# Patient Record
Sex: Female | Born: 2000 | Race: White | Hispanic: No | Marital: Married | Smoking: Former smoker
Health system: Southern US, Community
[De-identification: ages and names within clinical notes are randomized; demographics above are authoritative.]

## PROBLEM LIST (undated history)

## (undated) ENCOUNTER — Emergency Department (HOSPITAL_COMMUNITY): Admission: EM | Payer: BC Managed Care – PPO

## (undated) DIAGNOSIS — J302 Other seasonal allergic rhinitis: Secondary | ICD-10-CM

## (undated) DIAGNOSIS — M419 Scoliosis, unspecified: Secondary | ICD-10-CM

## (undated) DIAGNOSIS — O039 Complete or unspecified spontaneous abortion without complication: Secondary | ICD-10-CM

## (undated) DIAGNOSIS — F329 Major depressive disorder, single episode, unspecified: Secondary | ICD-10-CM

## (undated) DIAGNOSIS — N390 Urinary tract infection, site not specified: Secondary | ICD-10-CM

## (undated) DIAGNOSIS — F32A Depression, unspecified: Secondary | ICD-10-CM

## (undated) HISTORY — DX: Scoliosis, unspecified: M41.9

## (undated) HISTORY — DX: Other seasonal allergic rhinitis: J30.2

## (undated) HISTORY — DX: Depression, unspecified: F32.A

## (undated) HISTORY — DX: Major depressive disorder, single episode, unspecified: F32.9

## (undated) HISTORY — DX: Urinary tract infection, site not specified: N39.0

## (undated) HISTORY — PX: TONSILLECTOMY AND ADENOIDECTOMY: SUR1326

## (undated) HISTORY — DX: Complete or unspecified spontaneous abortion without complication: O03.9

---
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
## 2018-01-16 DIAGNOSIS — F330 Major depressive disorder, recurrent, mild: Secondary | ICD-10-CM | POA: Diagnosis not present

## 2018-01-30 DIAGNOSIS — F330 Major depressive disorder, recurrent, mild: Secondary | ICD-10-CM | POA: Diagnosis not present

## 2018-05-15 ENCOUNTER — Ambulatory Visit: Payer: BLUE CROSS/BLUE SHIELD | Admitting: Internal Medicine

## 2018-05-15 ENCOUNTER — Encounter: Payer: Self-pay | Admitting: Medical

## 2018-05-15 ENCOUNTER — Encounter: Payer: Self-pay | Admitting: Internal Medicine

## 2018-05-15 ENCOUNTER — Other Ambulatory Visit (HOSPITAL_COMMUNITY)
Admission: RE | Admit: 2018-05-15 | Discharge: 2018-05-15 | Disposition: A | Discharge status: Home / Self Care | Payer: BLUE CROSS/BLUE SHIELD | Source: Ambulatory Visit | Attending: Internal Medicine | Admitting: Internal Medicine

## 2018-05-15 VITALS — BP 104/70 | HR 85 | Temp 98.6°F | Resp 16 | Ht 63.0 in | Wt 126.0 lb

## 2018-05-15 DIAGNOSIS — N761 Subacute and chronic vaginitis: Secondary | ICD-10-CM | POA: Diagnosis not present

## 2018-05-15 DIAGNOSIS — F329 Major depressive disorder, single episode, unspecified: Secondary | ICD-10-CM | POA: Diagnosis not present

## 2018-05-15 DIAGNOSIS — N898 Other specified noninflammatory disorders of vagina: Secondary | ICD-10-CM

## 2018-05-15 DIAGNOSIS — R3915 Urgency of urination: Secondary | ICD-10-CM | POA: Diagnosis not present

## 2018-05-15 DIAGNOSIS — N926 Irregular menstruation, unspecified: Secondary | ICD-10-CM | POA: Diagnosis not present

## 2018-05-15 DIAGNOSIS — Z7722 Contact with and (suspected) exposure to environmental tobacco smoke (acute) (chronic): Secondary | ICD-10-CM | POA: Insufficient documentation

## 2018-05-15 LAB — POC URINALSYSI DIPSTICK (AUTOMATED)
Bilirubin, UA: NEGATIVE
Blood, UA: NEGATIVE
Glucose, UA: NEGATIVE
Ketones, UA: NEGATIVE
Leukocytes, UA: NEGATIVE
Nitrite, UA: NEGATIVE
PROTEIN UA: NEGATIVE
SPEC GRAV UA: 1.02 (ref 1.010–1.025)
UROBILINOGEN UA: 0.2 U/dL
pH, UA: 6.5 (ref 5.0–8.0)

## 2018-05-15 LAB — POCT URINE PREGNANCY: Preg Test, Ur: NEGATIVE

## 2018-05-15 NOTE — Progress Notes (Signed)
Subjective:    Patient ID: April Murray, female    DOB: 2001-02-20, 17 y.o.   MRN: [id number]  DOS:  05/15/2018 Type of visit - description : Acute visit Interval history: Patient reports some "odor" from her private area for the last 2 months. She takes birth control pills regularly,  LMP was in  July, did not have one in August. She is sexually active, has one partner, does not use condoms regularly.   Review of Systems Denies fever chills No nausea, vomiting, diarrhea Mild, occasional lower abdominal discomfort.  No dyspareunia. No vaginal discharge or rash No dysuria or gross hematuria.  Occasionally has urinary urgency.   Past Medical History:  Diagnosis Date  . Depression   . Scoliosis   . Seasonal allergies   . UTI (lower urinary tract infection)     Past Surgical History:  Procedure Laterality Date  . TONSILLECTOMY AND ADENOIDECTOMY      Social History   Socioeconomic History  . Marital status: Single    Spouse name: Not on file  . Number of children: Not on file  . Years of education: Not on file  . Highest education level: Not on file  Occupational History  . Not on file  Social Needs  . Financial resource strain: Not on file  . Food insecurity:    Worry: Not on file    Inability: Not on file  . Transportation needs:    Medical: Not on file    Non-medical: Not on file  Tobacco Use  . Smoking status: Passive Smoke Exposure - Never Smoker  . Smokeless tobacco: Never Used  . Tobacco comment: Mother smokes in her room  Substance and Sexual Activity  . Alcohol use: Not on file  . Drug use: Not on file  . Sexual activity: Not on file  Lifestyle  . Physical activity:    Days per week: Not on file    Minutes per session: Not on file  . Stress: Not on file  Relationships  . Social connections:    Talks on phone: Not on file    Gets together: Not on file    Attends religious service: Not on file    Active member of club or organization: Not on  file    Attends meetings of clubs or organizations: Not on file    Relationship status: Not on file  . Intimate partner violence:    Fear of current or ex partner: Not on file    Emotionally abused: Not on file    Physically abused: Not on file    Forced sexual activity: Not on file  Other Topics Concern  . Not on file  Social History Narrative   April Murray is an 11th grade student at [employer]; she does well in school but has bad days. She lives with her parents and brother.       Sees Terri Bauert, LCSW at [employer]      Allergies as of 05/15/2018      Reactions   Penicillins    Itching and swelling      Medication List        Accurate as of 05/15/18 11:59 PM. Always use your most recent med list.          norethindrone-ethinyl estradiol-iron 1-20/1-30/1-35 MG-MCG tablet Commonly known as:  ESTROSTEP FE,TILIA FE,TRI-LEGEST FE Take 1 tablet by mouth daily.          Objective:   Physical Exam BP 104/70 (  BP Location: Right Arm, Patient Position: Sitting, Cuff Size: Small)   Pulse 85   Temp 98.6 F (37 C) (Oral)   Resp 16   Ht 5\' 3"  (1.6 m)   Wt 126 lb (57.2 kg)   SpO2 98%   BMI 22.32 kg/m   General:   Well developed, NAD, see BMI.  HEENT:  Normocephalic . Face symmetric, atraumatic Lungs:  CTA B Normal respiratory effort, no intercostal retractions, no accessory muscle use. Heart: RRR,  no murmur.  no pretibial edema bilaterally  Abdomen:  Not distended, soft, minimal -if any-  discomfort on the mid- lower abdomen, no mass, rebound, guarding. Skin: Not pale. Not jaundice Neurologic:  alert & oriented X3.  Speech normal, gait appropriate for age and unassisted Psych--  Cognition and judgment appear intact.  Cooperative with normal attention span and concentration.  Behavior appropriate. No anxious or depressed appearing.     Assessment & Plan:   Healthy 17 year old female on birth control pills presents with Vaginitis ? Patient complains of  a vaginal odor, review of systems is essentially negative, minimal tenderness at the lower abdomen without guarding or mass. She needs a pelvic exam, desires to be refer to a gynecologist. Udip is negative.  UPT negative. Plan: Gyn  referral, within the next 2 weeks UA, urine culture, self wet prep for gonorrhea, chlamydia, trichomonas, yeast, gardnerella Until she is evaluated by gynecology, recommend to call if she has increased symptoms.

## 2018-05-15 NOTE — Patient Instructions (Signed)
We are referring you to a gynecologist  Please call anytime if you have fever, chills, vaginal discharge, vaginal bleeding or increase discomfort on the lower abdomen

## 2018-05-16 LAB — URINE CULTURE
MICRO NUMBER:: 91088209
SPECIMEN QUALITY:: ADEQUATE

## 2018-05-17 LAB — CERVICOVAGINAL ANCILLARY ONLY
BACTERIAL VAGINITIS: NEGATIVE
CANDIDA VAGINITIS: POSITIVE — AB
TRICH (WINDOWPATH): NEGATIVE

## 2018-06-03 ENCOUNTER — Telehealth: Payer: Self-pay

## 2018-06-03 NOTE — Telephone Encounter (Signed)
Copied from CRM [id number]. Topic: Referral - Status >> May 31, 2018 11:04 AM Herby Abraham C wrote: Reason for CRM: pt's mom Ola called in to follow up on referral to a urologist. She stated that she has not heard anything back. Please assist.   CB: [phone]

## 2018-06-05 ENCOUNTER — Telehealth: Payer: Self-pay

## 2018-06-05 NOTE — Telephone Encounter (Signed)
Copied from CRM [id number]. Topic: Referral - Status >> May 31, 2018 11:04 AM Herby Abraham C wrote: Reason for CRM: pt's mom Ola called in to follow up on referral to a urologist. She stated that she has not heard anything back. Please assist.   CB: [phone]

## 2018-06-05 NOTE — Telephone Encounter (Signed)
Looks like Dr. Drue Novel put in referral to gyn not urologist. Will you investigate with [facility] for update on the referral.

## 2018-07-09 ENCOUNTER — Ambulatory Visit: Payer: BLUE CROSS/BLUE SHIELD | Admitting: Medical

## 2018-07-09 ENCOUNTER — Ambulatory Visit: Payer: BLUE CROSS/BLUE SHIELD | Admitting: Family Medicine

## 2018-07-09 ENCOUNTER — Encounter: Payer: Self-pay | Admitting: Family Medicine

## 2018-07-09 VITALS — BP 108/80 | HR 108 | Temp 98.0°F | Resp 16 | Ht 63.0 in | Wt 124.4 lb

## 2018-07-09 DIAGNOSIS — R5383 Other fatigue: Secondary | ICD-10-CM | POA: Diagnosis not present

## 2018-07-09 LAB — CBC WITH DIFFERENTIAL/PLATELET
BASOS ABS: 0 10*3/uL (ref 0.0–0.1)
Basophils Relative: 0.5 % (ref 0.0–3.0)
EOS PCT: 0.6 % (ref 0.0–5.0)
Eosinophils Absolute: 0 10*3/uL (ref 0.0–0.7)
HCT: 39.8 % (ref 36.0–49.0)
HEMOGLOBIN: 13.4 g/dL (ref 12.0–16.0)
Lymphocytes Relative: 44.6 % (ref 24.0–48.0)
Lymphs Abs: 2.3 10*3/uL (ref 0.7–4.0)
MCHC: 33.7 g/dL (ref 31.0–37.0)
MCV: 88.8 fl (ref 78.0–98.0)
MONO ABS: 0.3 10*3/uL (ref 0.1–1.0)
Monocytes Relative: 6.1 % (ref 3.0–12.0)
NEUTROS PCT: 48.2 % (ref 43.0–71.0)
Neutro Abs: 2.5 10*3/uL (ref 1.4–7.7)
Platelets: 293 10*3/uL (ref 150.0–575.0)
RBC: 4.48 Mil/uL (ref 3.80–5.70)
RDW: 12.6 % (ref 11.4–15.5)
WBC: 5.2 10*3/uL (ref 4.5–13.5)

## 2018-07-09 LAB — POC URINALSYSI DIPSTICK (AUTOMATED)
Bilirubin, UA: NEGATIVE
Blood, UA: NEGATIVE
GLUCOSE UA: NEGATIVE
KETONES UA: NEGATIVE
LEUKOCYTES UA: NEGATIVE
Nitrite, UA: NEGATIVE
Protein, UA: POSITIVE — AB
SPEC GRAV UA: 1.025 (ref 1.010–1.025)
Urobilinogen, UA: 0.2 E.U./dL
pH, UA: 6 (ref 5.0–8.0)

## 2018-07-09 LAB — COMPREHENSIVE METABOLIC PANEL
ALBUMIN: 4.3 g/dL (ref 3.5–5.2)
ALK PHOS: 54 U/L (ref 47–119)
ALT: 9 U/L (ref 0–35)
AST: 12 U/L (ref 0–37)
BILIRUBIN TOTAL: 0.4 mg/dL (ref 0.2–1.2)
BUN: 6 mg/dL (ref 6–23)
CO2: 27 mEq/L (ref 19–32)
CREATININE: 0.66 mg/dL (ref 0.40–1.20)
Calcium: 9.5 mg/dL (ref 8.4–10.5)
Chloride: 106 mEq/L (ref 96–112)
GFR: 122.26 mL/min (ref >60.00)
GLUCOSE: 108 mg/dL — AB (ref 70–99)
POTASSIUM: 4 meq/L (ref 3.5–5.1)
SODIUM: 140 meq/L (ref 135–145)
TOTAL PROTEIN: 6.6 g/dL (ref 6.0–8.3)

## 2018-07-09 LAB — POC INFLUENZA A&B (BINAX/QUICKVUE)
INFLUENZA A, POC: NEGATIVE
INFLUENZA B, POC: NEGATIVE

## 2018-07-09 LAB — VITAMIN B12: Vitamin B-12: 130 pg/mL — ABNORMAL LOW (ref 211–911)

## 2018-07-09 LAB — POCT URINE PREGNANCY: Preg Test, Ur: NEGATIVE

## 2018-07-09 LAB — MONONUCLEOSIS SCREEN: MONO SCREEN: NEGATIVE

## 2018-07-09 LAB — TSH: TSH: 2.26 u[IU]/mL (ref 0.40–5.00)

## 2018-07-09 NOTE — Patient Instructions (Signed)

## 2018-07-09 NOTE — Progress Notes (Signed)
Patient ID: LADONA ROSTEN, female    DOB: 26-Apr-2001  Age: 17 y.o. MRN: [id number]    Subjective:  Subjective  HPI SHANNELL MIKKELSEN presents for fatigue, back ache and low grade fever 99.9  Pt is taking tylenol with some help.  Symptoms over a week   Fever started yesterday.  Fatigue started over a week ago.  No sick contacts.     Review of Systems  Constitutional: Positive for fatigue and fever. Negative for chills.  HENT: Negative for congestion and hearing loss.   Eyes: Negative for discharge.  Respiratory: Negative for cough and shortness of breath.   Cardiovascular: Negative for chest pain, palpitations and leg swelling.  Gastrointestinal: Negative for abdominal pain, blood in stool, constipation, diarrhea, nausea and vomiting.  Genitourinary: Negative for dysuria, frequency, hematuria and urgency.  Musculoskeletal: Positive for back pain and myalgias.  Skin: Negative for rash.  Allergic/Immunologic: Negative for environmental allergies.  Neurological: Negative for dizziness, weakness and headaches.  Hematological: Does not bruise/bleed easily.  Psychiatric/Behavioral: Negative for suicidal ideas. The patient is not nervous/anxious.     History Past Medical History:  Diagnosis Date  . Depression   . Scoliosis   . Seasonal allergies   . UTI (lower urinary tract infection)     She has a past surgical history that includes Tonsillectomy and adenoidectomy.   Her family history includes ADD / ADHD in her brother; Alcoholism in her paternal grandfather; Anxiety disorder in her mother; Arthritis in her maternal grandfather and maternal grandmother; Asthma in her mother; Bipolar disorder in her maternal uncle, mother, and unknown relative; COPD in her unknown relative; Depression in her maternal aunt, maternal grandmother, and mother; Endometriosis in her unknown relative; Healthy in her brother; Hyperlipidemia in her paternal grandmother; Hypertension in her maternal grandfather and  paternal grandfather; Irritable bowel syndrome in her unknown relative; Lung cancer in her other; Mental illness in her maternal uncle; Migraines in her father and unknown relative; Other in her maternal grandfather, maternal uncle, unknown relative, and unknown relative; Polycystic ovary syndrome in her unknown relative; Schizophrenia in her maternal uncle; Seizures in her mother; Stroke in her maternal grandfather; Syncope episode in her mother.She reports that she is a non-smoker but has been exposed to tobacco smoke. She has never used smokeless tobacco. Her alcohol and drug histories are not on file.  Current Outpatient Medications on File Prior to Visit  Medication Sig Dispense Refill  . norethindrone-ethinyl estradiol-iron (ESTROSTEP FE,TILIA FE,TRI-LEGEST FE) 1-20/1-30/1-35 MG-MCG tablet Take 1 tablet by mouth daily.     No current facility-administered medications on file prior to visit.      Objective:  Objective  Physical Exam  Constitutional: She is oriented to person, place, and time. She appears well-developed and well-nourished.  HENT:  Head: Normocephalic and atraumatic.  Eyes: Conjunctivae and EOM are normal.  Neck: Normal range of motion. Neck supple. No JVD present. Carotid bruit is not present. No thyromegaly present.  Cardiovascular: Normal rate, regular rhythm and normal heart sounds.  No murmur heard. Pulmonary/Chest: Effort normal and breath sounds normal. No respiratory distress. She has no wheezes. She has no rales. She exhibits no tenderness.  Abdominal: Soft. There is no tenderness.  Musculoskeletal: She exhibits no edema.  Neurological: She is alert and oriented to person, place, and time.  Psychiatric: She has a normal mood and affect.  Nursing note and vitals reviewed.  BP 108/80 (BP Location: Left Arm, Cuff Size: Normal)   Pulse (!) 108   Temp  98 F (36.7 C) (Oral)   Resp 16   Ht 5\' 3"  (1.6 m)   Wt 124 lb 6.4 oz (56.4 kg)   LMP 06/10/2018   SpO2 98%    BMI 22.04 kg/m  Wt Readings from Last 3 Encounters:  07/09/18 124 lb 6.4 oz (56.4 kg) (45 %, Z= -0.13)*  05/15/18 126 lb (57.2 kg) (49 %, Z= -0.03)*  06/27/17 129 lb 12.5 oz (58.9 kg) (60 %, Z= 0.25)*   * Growth percentiles are based on CDC (Girls, 2-20 Years) data.     Lab Results  Component Value Date   WBC 5.2 07/09/2018   HGB 13.4 07/09/2018   HCT 39.8 07/09/2018   PLT 293.0 07/09/2018   GLUCOSE 108 (H) 07/09/2018   ALT 9 07/09/2018   AST 12 07/09/2018   NA 140 07/09/2018   K 4.0 07/09/2018   CL 106 07/09/2018   CREATININE 0.66 07/09/2018   BUN 6 07/09/2018   CO2 27 07/09/2018   TSH 2.26 07/09/2018    No results found.   Assessment & Plan:  Plan  I am having Kelli Hope maintain her norethindrone-ethinyl estradiol-iron.  No orders of the defined types were placed in this encounter.   Problem List Items Addressed This Visit    None    Visit Diagnoses    Fatigue, unspecified type    -  Primary   Relevant Orders   CBC with Differential/Platelet (Completed)   Comprehensive metabolic panel (Completed)   Epstein-Barr virus VCA antibody panel   Monospot (Completed)   TSH (Completed)   Vitamin B12 (Completed)   POCT Urinalysis Dipstick (Automated) (Completed)   POCT urine pregnancy (Completed)   POC Influenza A&B (Binax test) (Completed)      Probable viral syndrome Check labs  Tylenol/ advil prn rto prn   Follow-up: Return if symptoms worsen or fail to improve.  Donato Schultz, DO

## 2018-07-10 ENCOUNTER — Telehealth: Payer: Self-pay

## 2018-07-10 LAB — EPSTEIN-BARR VIRUS VCA ANTIBODY PANEL
EBV NA IgG: 176 U/mL — ABNORMAL HIGH
EBV VCA IgG: 102 U/mL — ABNORMAL HIGH
EBV VCA IgM: <36 U/mL

## 2018-07-10 NOTE — Telephone Encounter (Signed)
Please advise 

## 2018-07-10 NOTE — Telephone Encounter (Signed)
Would you please write the school note stating return to school date pending results of lab work.

## 2018-07-10 NOTE — Telephone Encounter (Signed)
Copied from CRM [id number]. Topic: General - Inquiry >> Jul 10, 2018 10:15 AM Crist Infante wrote: Reason for CRM: mom states pt needs a note for school for Thurs and Fri.  They will not let pt return due to being tested for mono. Depending on results, may need note for longer. Please call mom Mom requesting the note be faxed to pt's school, [employer] Fax: [phone]

## 2018-07-16 NOTE — Telephone Encounter (Signed)
School excus enote has been faxed to school.  Negative mono test.

## 2018-08-07 ENCOUNTER — Encounter: Payer: Self-pay | Admitting: Medical

## 2018-08-07 ENCOUNTER — Ambulatory Visit: Payer: BLUE CROSS/BLUE SHIELD | Admitting: Medical

## 2018-08-07 VITALS — BP 118/76 | HR 93 | Temp 98.3°F | Resp 16 | Ht 63.0 in | Wt 126.2 lb

## 2018-08-07 DIAGNOSIS — R110 Nausea: Secondary | ICD-10-CM | POA: Diagnosis not present

## 2018-08-07 DIAGNOSIS — R21 Rash and other nonspecific skin eruption: Secondary | ICD-10-CM | POA: Diagnosis not present

## 2018-08-07 DIAGNOSIS — R42 Dizziness and giddiness: Secondary | ICD-10-CM

## 2018-08-07 DIAGNOSIS — R5383 Other fatigue: Secondary | ICD-10-CM | POA: Diagnosis not present

## 2018-08-07 LAB — CBC WITH DIFFERENTIAL/PLATELET
BASOS PCT: 0.4 % (ref 0.0–3.0)
Basophils Absolute: 0 10*3/uL (ref 0.0–0.1)
EOS PCT: 2.3 % (ref 0.0–5.0)
Eosinophils Absolute: 0.1 10*3/uL (ref 0.0–0.7)
HCT: 41.7 % (ref 36.0–49.0)
HEMOGLOBIN: 14.1 g/dL (ref 12.0–16.0)
Lymphocytes Relative: 24.1 % (ref 24.0–48.0)
Lymphs Abs: 1.1 10*3/uL (ref 0.7–4.0)
MCHC: 33.8 g/dL (ref 31.0–37.0)
MCV: 89.3 fl (ref 78.0–98.0)
MONO ABS: 0.4 10*3/uL (ref 0.1–1.0)
Monocytes Relative: 9 % (ref 3.0–12.0)
Neutro Abs: 3 10*3/uL (ref 1.4–7.7)
Neutrophils Relative %: 64.2 % (ref 43.0–71.0)
Platelets: 240 10*3/uL (ref 150.0–575.0)
RBC: 4.66 Mil/uL (ref 3.80–5.70)
RDW: 13 % (ref 11.4–15.5)
WBC: 4.6 10*3/uL (ref 4.5–13.5)

## 2018-08-07 LAB — COMPREHENSIVE METABOLIC PANEL
ALBUMIN: 4.4 g/dL (ref 3.5–5.2)
ALK PHOS: 61 U/L (ref 47–119)
ALT: 9 U/L (ref 0–35)
AST: 12 U/L (ref 0–37)
BUN: 5 mg/dL — ABNORMAL LOW (ref 6–23)
CALCIUM: 9.2 mg/dL (ref 8.4–10.5)
CO2: 25 mEq/L (ref 19–32)
Chloride: 108 mEq/L (ref 96–112)
Creatinine, Ser: 0.54 mg/dL (ref 0.40–1.20)
GFR: 153.99 mL/min (ref >60.00)
Glucose, Bld: 89 mg/dL (ref 70–99)
POTASSIUM: 3.9 meq/L (ref 3.5–5.1)
Sodium: 140 mEq/L (ref 135–145)
TOTAL PROTEIN: 6.6 g/dL (ref 6.0–8.3)
Total Bilirubin: 0.5 mg/dL (ref 0.2–1.2)

## 2018-08-07 LAB — TSH: TSH: 0.87 u[IU]/mL (ref 0.40–5.00)

## 2018-08-07 LAB — VITAMIN B12: VITAMIN B 12: 128 pg/mL — AB (ref 211–911)

## 2018-08-07 LAB — IRON: IRON: 99 ug/dL (ref 42–145)

## 2018-08-07 MED ORDER — TRIAMCINOLONE ACETONIDE 0.1 % EX CREA: 1.0000 "application " | TOPICAL_CREAM | Freq: Two times a day (BID) | CUTANEOUS | 0 refills | Status: DC

## 2018-08-07 NOTE — Patient Instructions (Signed)
You do have faint transient wrist rash and left arm rash.  Minimally present now.  But recurring intermittently over the last 3 months.  I do want you to moisturize twice daily with Aveeno, Lubriderm or Cetaphil.  Also if you do notice prominent rash then apply triamcinolone twice daily for 3 to 5 days to see if the area clears.  If you get any itching then can use of Benadryl.  If you do have prominent intermittent rash would recommend that you take pictures of the area as we might need to refer you to allergist in the near future and pictures of intermittent rash may be helpful.  Particularly if on the day you go to the referral no rash is present.  Also try to potentially make association with particular exposures.  However that can be difficult.  For history of mild fatigue, intermittent dizziness and occasional nausea, I did place lab orders to get a CBC, CMP, TSH, B12, B1 and iron level.  Urine pregnancy test done as well since try to get pregnant and some intermittent rare nausea.  Follow-up in 2 to 3 weeks or as needed.

## 2018-08-07 NOTE — Progress Notes (Signed)
Subjective:    Patient ID: April Murray, female    DOB: 2000/11/05, 17 y.o.   MRN: [id number]  HPI  Pt in with some faint specs/pink dots on and off to wrist and her left upper ext. Not reporting any itching. No insect bites. No new exposures that she is aware of that she associates with transient faint rash. This has been coming and going for past 3 months. No hx of eczema or sensitive skin.  No report of any lip swelling, sob or wheezing.  Pt also states very rare and occasional transient dizziness. Sometimes she notes after standing from seated position. Last for 5 minutes at most. Some times happens during menstrual cycle. Pt on cycle presently. She states normal cycle.   Pt states trying to get pregnant. Stopped ocp last week.  Occasional nausea reported that is rare. No gerd type symptoms. No vomiting.  Notes prior umbilical ring placed recently. She  described infection with yellow dc from ring. She took out ring and area cleared/normalized last week.    Review of Systems  Constitutional: Positive for fatigue. Negative for chills and fever.  Eyes: Negative for photophobia, pain and visual disturbance.  Respiratory: Negative for cough, choking, shortness of breath and wheezing.   Cardiovascular: Negative for chest pain and palpitations.  Gastrointestinal: Positive for nausea. Negative for abdominal distention, abdominal pain, diarrhea and vomiting.  Musculoskeletal: Negative for back pain.  Skin: Positive for rash.  Neurological: Positive for dizziness. Negative for syncope, weakness and headaches.       Rare dizziness.  Hematological: Negative for adenopathy. Does not bruise/bleed easily.  Psychiatric/Behavioral: Negative for behavioral problems and confusion.    Past Medical History:  Diagnosis Date  . Depression   . Scoliosis   . Seasonal allergies   . UTI (lower urinary tract infection)      Social History   Socioeconomic History  . Marital status: Single   Spouse name: Not on file  . Number of children: Not on file  . Years of education: Not on file  . Highest education level: Not on file  Occupational History  . Not on file  Social Needs  . Financial resource strain: Not on file  . Food insecurity:    Worry: Not on file    Inability: Not on file  . Transportation needs:    Medical: Not on file    Non-medical: Not on file  Tobacco Use  . Smoking status: Passive Smoke Exposure - Never Smoker  . Smokeless tobacco: Never Used  . Tobacco comment: Mother smokes in her room  Substance and Sexual Activity  . Alcohol use: Not on file  . Drug use: Not on file  . Sexual activity: Not on file  Lifestyle  . Physical activity:    Days per week: Not on file    Minutes per session: Not on file  . Stress: Not on file  Relationships  . Social connections:    Talks on phone: Not on file    Gets together: Not on file    Attends religious service: Not on file    Active member of club or organization: Not on file    Attends meetings of clubs or organizations: Not on file    Relationship status: Not on file  . Intimate partner violence:    Fear of current or ex partner: Not on file    Emotionally abused: Not on file    Physically abused: Not on file  Forced sexual activity: Not on file  Other Topics Concern  . Not on file  Social History Narrative   Laynie is an 11th grade student at [employer]; she does well in school but has bad days. She lives with her parents and brother.       Sees Salomon Fick, LCSW at [place]    Past Surgical History:  Procedure Laterality Date  . TONSILLECTOMY AND ADENOIDECTOMY      Family History  Problem Relation Age of Onset  . Bipolar disorder Unknown   . Asthma Mother   . Syncope episode Mother   . Seizures Mother   . Bipolar disorder Mother   . Depression Mother   . Anxiety disorder Mother   . COPD Unknown   . Endometriosis Unknown   . Polycystic ovary syndrome Unknown   . Other  Unknown        DDD  . Irritable bowel syndrome Unknown   . Other Unknown        Inter cystitis  . Migraines Unknown   . Migraines Father   . Stroke Maternal Grandfather   . Arthritis Maternal Grandfather   . Other Maternal Grandfather        DDD  . Hypertension Maternal Grandfather   . Arthritis Maternal Grandmother   . Depression Maternal Grandmother   . Alcoholism Paternal Grandfather   . Hypertension Paternal Grandfather   . Hyperlipidemia Paternal Grandmother   . Lung cancer Other        Paternal side Aunts & Uncles  . Bipolar disorder Maternal Uncle   . Schizophrenia Maternal Uncle   . Other Maternal Uncle        DDD  . Mental illness Maternal Uncle   . Healthy Brother   . ADD / ADHD Brother   . Depression Maternal Aunt   . Autism Neg Hx     Allergies  Allergen Reactions  . Penicillins     Itching and swelling    No current outpatient medications on file prior to visit.   No current facility-administered medications on file prior to visit.     BP 118/76   Pulse 93   Temp 98.3 F (36.8 C) (Oral)   Resp 16   Ht 5\' 3"  (1.6 m)   Wt 126 lb 3.2 oz (57.2 kg)   SpO2 100%   BMI 22.36 kg/m       Objective:   Physical Exam  General Mental Status- Alert. General Appearance- Not in acute distress.   Skin Faint pink-colored specks on the right wrist dorsal aspect.  Numbering about 3.  Barely visible today.  Not raised.  Left wrist is clear.  Left upper extremity/bicep.  No rash present.  Though this is an area where patient reports intermittent reoccurrence.    Chest and Lung Exam Auscultation: Breath Sounds:-Normal.  Cardiovascular Auscultation:Rythm- Regular. Murmurs & Other Heart Sounds:Auscultation of the heart reveals- No Murmurs.  Abdomen Inspection:-Inspeection Normal. Palpation/Percussion:Note:No mass. Palpation and Percussion of the abdomen reveal- Non Tender, Non Distended + BS, no rebound or guarding. Belly button ring site now healed. No  redness or warmth.   Neurologic Cranial Nerve exam:- CN III-XII intact(No nystagmus), symmetric smile. Strength:- 5/5 equal and symmetric strength both upper and lower extremities.      Assessment & Plan:  You do have faint transient wrist rash and left arm rash.  Minimally present now.  But recurring intermittently over the last 3 months.  I do want you to moisturize twice  daily with Aveeno, Lubriderm or Cetaphil.  Also if you do notice prominent rash then apply triamcinolone twice daily for 3 to 5 days to see if the area clears.  If you get any itching then can use of Benadryl.  If you do have prominent intermittent rash would recommend that you take pictures of the area as we might need to refer you to allergist in the near future and pictures of intermittent rash may be helpful.  Particularly if on the day you go to the referral no rash is present.  Also try to potentially make association with particular exposures.  However that can be difficult.  For history of mild fatigue, intermittent dizziness and occasional nausea, I did place lab orders to get a CBC, CMP, TSH, B12, B1 and iron level.  Urine pregnancy test done as well since try to get pregnant and some intermittent rare nausea.  Follow-up in 2 to 3 weeks or as needed.  Esperanza RichtersEdward Laquita Harlan, PA-C

## 2018-08-08 LAB — POCT URINE PREGNANCY: Preg Test, Ur: NEGATIVE

## 2018-08-08 NOTE — Addendum Note (Signed)
Addended by: Orlene OchRENCE, Shawneequa Baldridge N on: 08/08/2018 08:48 AM   Modules accepted: Orders

## 2018-08-12 LAB — VITAMIN B1: Vitamin B1 (Thiamine): 14 nmol/L (ref 8–30)

## 2018-10-14 ENCOUNTER — Other Ambulatory Visit: Payer: Self-pay

## 2018-10-14 ENCOUNTER — Ambulatory Visit (INDEPENDENT_AMBULATORY_CARE_PROVIDER_SITE_OTHER): Payer: Medicaid Other | Admitting: *Deleted

## 2018-10-14 DIAGNOSIS — Z3200 Encounter for pregnancy test, result unknown: Secondary | ICD-10-CM

## 2018-10-14 DIAGNOSIS — Z3201 Encounter for pregnancy test, result positive: Secondary | ICD-10-CM

## 2018-10-14 LAB — POCT PREGNANCY, URINE: PREG TEST UR: POSITIVE — AB

## 2018-10-14 NOTE — Progress Notes (Signed)
Here for pregnancy test which was positive. LMP 09/08/18 which makes her 100w1d with EDD of 06/15/19. Advised to start prenatal care and prenatal vitamins. She voices understanding.  Legrand Como

## 2018-10-15 NOTE — Progress Notes (Signed)
Patient ID: April Murray, female   DOB: [birth year], 18 y.o.   MRN: [id number] I have reviewed the chart and agree with nursing staff's documentation of this patient's encounter.  Scheryl DarterJames Arnold, MD 10/15/2018 4:30 PM

## 2018-10-31 ENCOUNTER — Other Ambulatory Visit: Payer: Self-pay | Admitting: Obstetrics and Gynecology

## 2018-10-31 DIAGNOSIS — O3680X0 Pregnancy with inconclusive fetal viability, not applicable or unspecified: Secondary | ICD-10-CM

## 2018-11-04 ENCOUNTER — Other Ambulatory Visit: Payer: Self-pay | Admitting: Obstetrics and Gynecology

## 2018-11-04 ENCOUNTER — Ambulatory Visit (INDEPENDENT_AMBULATORY_CARE_PROVIDER_SITE_OTHER): Payer: BLUE CROSS/BLUE SHIELD

## 2018-11-04 DIAGNOSIS — O3680X0 Pregnancy with inconclusive fetal viability, not applicable or unspecified: Secondary | ICD-10-CM | POA: Diagnosis not present

## 2018-11-04 DIAGNOSIS — O30001 Twin pregnancy, unspecified number of placenta and unspecified number of amniotic sacs, first trimester: Secondary | ICD-10-CM

## 2018-11-04 DIAGNOSIS — Z3A08 8 weeks gestation of pregnancy: Secondary | ICD-10-CM

## 2018-11-04 NOTE — Progress Notes (Signed)
DI/DI TWINS: discrepancy in GS sac size, normal ovaries bilat BABY A: GS 32.8 mm=8+3 wks,crl 4.93 mm=6+1 wks,no FHT visualized BABY B: GS 13.4 mm=6+1 wks,crl 1.5 mm=OOR,no FHT visualized  PT will come back for F/U per Dr Despina Hidden

## 2018-11-14 ENCOUNTER — Other Ambulatory Visit: Payer: Self-pay | Admitting: Obstetrics & Gynecology

## 2018-11-14 DIAGNOSIS — O30001 Twin pregnancy, unspecified number of placenta and unspecified number of amniotic sacs, first trimester: Secondary | ICD-10-CM

## 2018-11-14 DIAGNOSIS — O3680X0 Pregnancy with inconclusive fetal viability, not applicable or unspecified: Secondary | ICD-10-CM

## 2018-11-15 ENCOUNTER — Encounter: Payer: Self-pay | Admitting: Obstetrics & Gynecology

## 2018-11-15 ENCOUNTER — Ambulatory Visit (INDEPENDENT_AMBULATORY_CARE_PROVIDER_SITE_OTHER): Payer: BC Managed Care – PPO | Admitting: Obstetrics & Gynecology

## 2018-11-15 ENCOUNTER — Other Ambulatory Visit: Payer: Self-pay

## 2018-11-15 ENCOUNTER — Ambulatory Visit (INDEPENDENT_AMBULATORY_CARE_PROVIDER_SITE_OTHER): Payer: BLUE CROSS/BLUE SHIELD

## 2018-11-15 VITALS — BP 119/74 | HR 77 | Ht 63.0 in | Wt 133.0 lb

## 2018-11-15 DIAGNOSIS — Z3A08 8 weeks gestation of pregnancy: Secondary | ICD-10-CM

## 2018-11-15 DIAGNOSIS — O021 Missed abortion: Secondary | ICD-10-CM

## 2018-11-15 DIAGNOSIS — O30001 Twin pregnancy, unspecified number of placenta and unspecified number of amniotic sacs, first trimester: Secondary | ICD-10-CM | POA: Diagnosis not present

## 2018-11-15 DIAGNOSIS — O3680X0 Pregnancy with inconclusive fetal viability, not applicable or unspecified: Secondary | ICD-10-CM

## 2018-11-15 NOTE — Progress Notes (Signed)
[place] DI/DT twins,normal ovaries bilat BABY A :CRL 4.03 mm=6+1 wks,no FHT,GS 35.5 mm=8+5 wks BABY B: fetal pole not visualized,GS w/ys 10 mm=5+5 wks,

## 2018-11-15 NOTE — Progress Notes (Signed)
Follow up appointment for results  Chief Complaint  Patient presents with  . Follow-up    u/s twins    Blood pressure 119/74, pulse 77, height 5\' 3"  (1.6 m), weight 133 lb (60.3 kg), last menstrual period 09/08/2018.  US Ob Transvaginal  Result Date: 11/15/2018 TWINS FOLLOW UP SONOGRAM April SAESEE is in the office for a follow up sonogram of a twin gestation. She is a 18 y.o. year old G1P0 with Estimated Date of Delivery: 06/15/2019. by LMP now at  9+[redacted]weeks gestation. Thus far the pregnancy has been otherwise complicated by no FHT on prior ultrasound. The twins are dichorionic/diamniotic. TWIN A GESTATION: FETAL ACTIVITY:          Heart rate         No FHT         CERVIX: Appears closed ADNEXA: The ovaries are normal. GESTATIONAL AGE AND  BIOMETRICS: Gestational criteria: Estimated Date of Delivery: 06/15/2019 by LMP now at 9+5 wks Previous Scans:1     GS         35.5 mm     8+5  wks CRL          4.03 mm      6+1 wks                                                                   AVERAGE EGA(BY THIS SCAN):  6+1 weeks                                               SUSPECTED ABNORMALITIES:  yes,no fht QUALITY OF SCAN: Satisfactory TWIN B GESTATION: FETAL ACTIVITY:          Heart rate         No fetal pole visualized         GESTATIONAL AGE AND  BIOMETRICS: Gestational criteria: Estimated Date of Delivery: 06/15/2019 by LMP now at 9+5 wks Previous Scans:1       GS w/ys    10 mm     5+5 wks                                                                   AVERAGE EGA(BY THIS SCAN):  5+5 weeks                                            SUSPECTED ABNORMALITIES:  yes,no fetal pole visualized QUALITY OF SCAN: Satisfactory TECHNICIAN COMMENTS: [place] DI/DT twins,normal ovaries bilat BABY A :CRL 4.03 mm=6+1 wks,no FHT,GS 35.5 mm=8+5 wks BABY B: fetal pole not visualized,GS w/ys 10 mm=5+5 wks, A copy of this report including all images has been saved and backed up to a second source for retrieval if needed. All  measures and details of the anatomical scan,  placentation, fluid volume and pelvic anatomy are contained in that report. April Murray 11/15/2018 1:31 PM Clinical Impression and recommendations: I have reviewed the sonogram results above, combined with the patient's current clinical course, below are my impressions and any appropriate recommendations for management based on the sonographic findings. See sonogram + report from 11/04/2018: comparison study Non viable early twin pregnancy, with twin B almost being resorbed Twin A with no interval growth in CRL and, once agin,  no fetal cardiac activity is noted G1P0 Estimated Date of Delivery: None noted. LMP/pregnancy size discrepancy is present Normal general sonographic findings Missed Ab of a twin pregnancy This recommendation follows SRU consensus guidelines: Diagnostic Criteria for Nonviable Pregnancy Early in the First Trimester. April Murray Med 2013; [id number]. April Murray 11/15/2018 1:38 PM      MEDS ordered this encounter: No orders of the defined types were placed in this encounter.   Orders for this encounter: No orders of the defined types were placed in this encounter.   Impression: Missed Ab in first trimester of a twin pregnancy  Plan: Pt given information regarding management, conservative vs cytotec She is obviously upset today and will delay decision, she will communicate with me via MyChart with her decision or any questions  Follow Up: Return if symptoms worsen or fail to improve.       Face to face time:  15 minutes  Greater than 50% of the visit time was spent in counseling and coordination of care with the patient.  The summary and outline of the counseling and care coordination is summarized in the note above.   All questions were answered.  Past Medical History:  Diagnosis Date  . Depression   . Scoliosis   . Seasonal allergies   . UTI (lower urinary tract infection)     Past Surgical History:  Procedure  Laterality Date  . TONSILLECTOMY AND ADENOIDECTOMY      OB History    Gravida  1   Para      Term      Preterm      AB      Living        SAB      TAB      Ectopic      Multiple      Live Births              Allergies  Allergen Reactions  . Penicillins     Itching and swelling    Social History   Socioeconomic History  . Marital status: Single    Spouse name: Not on file  . Number of children: Not on file  . Years of education: Not on file  . Highest education level: Not on file  Occupational History  . Not on file  Social Needs  . Financial resource strain: Not on file  . Food insecurity:    Worry: Not on file    Inability: Not on file  . Transportation needs:    Medical: Not on file    Non-medical: Not on file  Tobacco Use  . Smoking status: Passive Smoke Exposure - Never Smoker  . Smokeless tobacco: Never Used  . Tobacco comment: Mother smokes in her room  Substance and Sexual Activity  . Alcohol use: Not Currently    Alcohol/week: 0.0 standard drinks  . Drug use: Not Currently  . Sexual activity: Yes  Lifestyle  . Physical activity:    Days per week: Not on  file    Minutes per session: Not on file  . Stress: Not on file  Relationships  . Social connections:    Talks on phone: Not on file    Gets together: Not on file    Attends religious service: Not on file    Active member of club or organization: Not on file    Attends meetings of clubs or organizations: Not on file    Relationship status: Not on file  Other Topics Concern  . Not on file  Social History Narrative   Janesa is an 11th grade student at [employer]; she does well in school but has bad days. She lives with her parents and brother.       Sees Salomon Fick, LCSW at [place]    Family History  Problem Relation Age of Onset  . Bipolar disorder Other   . Asthma Mother   . Syncope episode Mother   . Seizures Mother   . Bipolar disorder Mother   .  Depression Mother   . Anxiety disorder Mother   . COPD Other   . Endometriosis Other   . Polycystic ovary syndrome Other   . Other Other        DDD  . Irritable bowel syndrome Other   . Other Other        Inter cystitis  . Migraines Other   . Migraines Father   . Stroke Maternal Grandfather   . Arthritis Maternal Grandfather   . Other Maternal Grandfather        DDD  . Hypertension Maternal Grandfather   . Arthritis Maternal Grandmother   . Depression Maternal Grandmother   . Alcoholism Paternal Grandfather   . Hypertension Paternal Grandfather   . Hyperlipidemia Paternal Grandmother   . Lung cancer Other        Paternal side Aunts & Uncles  . Bipolar disorder Maternal Uncle   . Schizophrenia Maternal Uncle   . Other Maternal Uncle        DDD  . Mental illness Maternal Uncle   . Healthy Brother   . ADD / ADHD Brother   . Depression Maternal Aunt   . Autism Neg Hx

## 2018-11-25 ENCOUNTER — Encounter: Payer: Self-pay | Admitting: Obstetrics & Gynecology

## 2018-11-25 ENCOUNTER — Other Ambulatory Visit: Payer: Self-pay

## 2018-11-25 ENCOUNTER — Ambulatory Visit (INDEPENDENT_AMBULATORY_CARE_PROVIDER_SITE_OTHER): Payer: BC Managed Care – PPO | Admitting: Obstetrics & Gynecology

## 2018-11-25 VITALS — BP 127/78 | HR 102 | Ht 63.0 in | Wt 134.0 lb

## 2018-11-25 DIAGNOSIS — O021 Missed abortion: Secondary | ICD-10-CM | POA: Diagnosis not present

## 2018-11-25 MED ORDER — MISOPROSTOL 200 MCG PO TABS: ORAL_TABLET | ORAL | 1 refills | Status: DC

## 2018-11-25 MED ORDER — HYDROCODONE-ACETAMINOPHEN 5-325 MG PO TABS: 1.0000 | ORAL_TABLET | Freq: Four times a day (QID) | ORAL | 0 refills | Status: DC | PRN

## 2018-11-25 NOTE — Progress Notes (Signed)
Chief Complaint  Patient presents with  . Miscarriage      18 y.o. G1P0 Patient's last menstrual period was 09/08/2018. The current method of family planning is none.  Outpatient Encounter Medications as of 11/25/2018  Medication Sig  . HYDROcodone-acetaminophen (NORCO/VICODIN) 5-325 MG tablet Take 1 tablet by mouth every 6 (six) hours as needed.  . misoprostol (CYTOTEC) 200 MCG tablet Take 800 micrograms orally repeat in 24 hours or greater if does not miscarriage   No facility-administered encounter medications on file as of 11/25/2018.     Subjective Pt has non viable twin pregnancy, was managing conservatively now wants to do cytotec Minimal spotting since last week Minimal cramping Past Medical History:  Diagnosis Date  . Depression   . Scoliosis   . Seasonal allergies   . UTI (lower urinary tract infection)     Past Surgical History:  Procedure Laterality Date  . TONSILLECTOMY AND ADENOIDECTOMY      OB History    Gravida  1   Para      Term      Preterm      AB      Living        SAB      TAB      Ectopic      Multiple      Live Births              Allergies  Allergen Reactions  . Penicillins     Itching and swelling    Social History   Socioeconomic History  . Marital status: Single    Spouse name: Not on file  . Number of children: Not on file  . Years of education: Not on file  . Highest education level: Not on file  Occupational History  . Not on file  Social Needs  . Financial resource strain: Not on file  . Food insecurity:    Worry: Not on file    Inability: Not on file  . Transportation needs:    Medical: Not on file    Non-medical: Not on file  Tobacco Use  . Smoking status: Passive Smoke Exposure - Never Smoker  . Smokeless tobacco: Never Used  . Tobacco comment: Mother smokes in her room  Substance and Sexual Activity  . Alcohol use: Not Currently    Alcohol/week: 0.0 standard drinks  . Drug use:  Not Currently  . Sexual activity: Yes  Lifestyle  . Physical activity:    Days per week: Not on file    Minutes per session: Not on file  . Stress: Not on file  Relationships  . Social connections:    Talks on phone: Not on file    Gets together: Not on file    Attends religious service: Not on file    Active member of club or organization: Not on file    Attends meetings of clubs or organizations: Not on file    Relationship status: Not on file  Other Topics Concern  . Not on file  Social History Narrative   April Murray is an 11th grade student at [employer]; she does well in school but has bad days. She lives with her parents and brother.       Sees Salomon Fick, LCSW at [place]    Family History  Problem Relation Age of Onset  . Bipolar disorder Other   . Asthma Mother   . Syncope episode Mother   . Seizures  Mother   . Bipolar disorder Mother   . Depression Mother   . Anxiety disorder Mother   . COPD Other   . Endometriosis Other   . Polycystic ovary syndrome Other   . Other Other        DDD  . Irritable bowel syndrome Other   . Other Other        Inter cystitis  . Migraines Other   . Migraines Father   . Stroke Maternal Grandfather   . Arthritis Maternal Grandfather   . Other Maternal Grandfather        DDD  . Hypertension Maternal Grandfather   . Arthritis Maternal Grandmother   . Depression Maternal Grandmother   . Alcoholism Paternal Grandfather   . Hypertension Paternal Grandfather   . Hyperlipidemia Paternal Grandmother   . Lung cancer Other        Paternal side Aunts & Uncles  . Bipolar disorder Maternal Uncle   . Schizophrenia Maternal Uncle   . Other Maternal Uncle        DDD  . Mental illness Maternal Uncle   . Healthy Brother   . ADD / ADHD Brother   . Depression Maternal Aunt   . Autism Neg Hx     Medications:       Current Outpatient Medications:  .  HYDROcodone-acetaminophen (NORCO/VICODIN) 5-325 MG tablet, Take 1 tablet by mouth  every 6 (six) hours as needed., Disp: 8 tablet, Rfl: 0 .  misoprostol (CYTOTEC) 200 MCG tablet, Take 800 micrograms orally repeat in 24 hours or greater if does not miscarriage, Disp: 4 tablet, Rfl: 1  Objective Blood pressure 127/78, pulse (!) 102, height 5\' 3"  (1.6 m), weight 134 lb (60.8 kg), last menstrual period 09/08/2018.    Pertinent ROS   Labs or studies Reviewed scans    Impression Diagnoses this Encounter::   ICD-10-CM   1. Missed ab, twin pregnancy O02.1     Established relevant diagnosis(es):   Plan/Recommendations: Meds ordered this encounter  Medications  . misoprostol (CYTOTEC) 200 MCG tablet    Sig: Take 800 micrograms orally repeat in 24 hours or greater if does not miscarriage    Dispense:  4 tablet    Refill:  1  . HYDROcodone-acetaminophen (NORCO/VICODIN) 5-325 MG tablet    Sig: Take 1 tablet by mouth every 6 (six) hours as needed.    Dispense:  8 tablet    Refill:  0    Labs or Scans Ordered: No orders of the defined types were placed in this encounter.   Management:: >pt requests cytotec Also given hydrocodone  Be seen in office 2 weeks after actual passage of the pregnancy  Follow up Return if symptoms worsen or fail to improve.        Face to face time:  15 minutes  Greater than 50% of the visit time was spent in counseling and coordination of care with the patient.  The summary and outline of the counseling and care coordination is summarized in the note above.   All questions were answered.

## 2018-12-09 ENCOUNTER — Telehealth: Payer: Self-pay | Admitting: Adult Health

## 2018-12-09 NOTE — Telephone Encounter (Signed)
Pt needs visit with Dr. Despina Hidden to followup on SAB. She has passed tissue.

## 2018-12-09 NOTE — Telephone Encounter (Signed)
Patient called stating that she would like to schedule an appointment, patient did not state the reason. Please contact pt

## 2018-12-10 ENCOUNTER — Ambulatory Visit (INDEPENDENT_AMBULATORY_CARE_PROVIDER_SITE_OTHER): Payer: BC Managed Care – PPO | Admitting: Obstetrics & Gynecology

## 2018-12-10 ENCOUNTER — Other Ambulatory Visit: Payer: Self-pay

## 2018-12-10 ENCOUNTER — Encounter: Payer: Self-pay | Admitting: Obstetrics & Gynecology

## 2018-12-10 VITALS — BP 120/79 | HR 97 | Temp 98.9°F | Ht 63.0 in | Wt 130.0 lb

## 2018-12-10 DIAGNOSIS — O021 Missed abortion: Secondary | ICD-10-CM | POA: Diagnosis not present

## 2018-12-10 DIAGNOSIS — O039 Complete or unspecified spontaneous abortion without complication: Secondary | ICD-10-CM

## 2018-12-10 NOTE — Progress Notes (Signed)
Chief Complaint  Patient presents with  . Follow-up    on miscarriage      18 y.o. G1P0010 Patient's last menstrual period was 12/09/2018. The current method of family planning is none.  Outpatient Encounter Medications as of 12/10/2018  Medication Sig  . [DISCONTINUED] HYDROcodone-acetaminophen (NORCO/VICODIN) 5-325 MG tablet Take 1 tablet by mouth every 6 (six) hours as needed.  . [DISCONTINUED] misoprostol (CYTOTEC) 200 MCG tablet Take 800 micrograms orally repeat in 24 hours or greater if does not miscarriage   No facility-administered encounter medications on file as of 12/10/2018.     Subjective Pt had a missed AB diagnosed and subsequently used cytotec to effect miscarriage 2+ weeks ago Here for routine follow up Some spotting still No pain or fever No foul smelling discharge Past Medical History:  Diagnosis Date  . Depression   . Miscarriage   . Scoliosis   . Seasonal allergies   . UTI (lower urinary tract infection)     Past Surgical History:  Procedure Laterality Date  . TONSILLECTOMY AND ADENOIDECTOMY      OB History    Gravida  1   Para      Term      Preterm      AB  1   Living        SAB  1   TAB      Ectopic      Multiple      Live Births              Allergies  Allergen Reactions  . Penicillins     Itching and swelling    Social History   Socioeconomic History  . Marital status: Single    Spouse name: Not on file  . Number of children: Not on file  . Years of education: Not on file  . Highest education level: Not on file  Occupational History  . Not on file  Social Needs  . Financial resource strain: Not on file  . Food insecurity:    Worry: Not on file    Inability: Not on file  . Transportation needs:    Medical: Not on file    Non-medical: Not on file  Tobacco Use  . Smoking status: Passive Smoke Exposure - Never Smoker  . Smokeless tobacco: Never Used  . Tobacco comment: Mother smokes in her room   Substance and Sexual Activity  . Alcohol use: Not Currently    Alcohol/week: 0.0 standard drinks  . Drug use: Not Currently  . Sexual activity: Yes    Birth control/protection: None  Lifestyle  . Physical activity:    Days per week: Not on file    Minutes per session: Not on file  . Stress: Not on file  Relationships  . Social connections:    Talks on phone: Not on file    Gets together: Not on file    Attends religious service: Not on file    Active member of club or organization: Not on file    Attends meetings of clubs or organizations: Not on file    Relationship status: Not on file  Other Topics Concern  . Not on file  Social History Narrative   April Murray is an 11th grade student at [employer]; she does well in school but has bad days. She lives with her parents and brother.       Sees Salomon Fickerri Bauert, LCSW at [employer]    Family History  Problem Relation Age of Onset  . Bipolar disorder Other   . Asthma Mother   . Syncope episode Mother   . Seizures Mother   . Bipolar disorder Mother   . Depression Mother   . Anxiety disorder Mother   . COPD Other   . Endometriosis Other   . Polycystic ovary syndrome Other   . Other Other        DDD  . Irritable bowel syndrome Other   . Other Other        Inter cystitis  . Migraines Other   . Migraines Father   . Stroke Maternal Grandfather   . Arthritis Maternal Grandfather   . Other Maternal Grandfather        DDD  . Hypertension Maternal Grandfather   . Arthritis Maternal Grandmother   . Depression Maternal Grandmother   . Alcoholism Paternal Grandfather   . Hypertension Paternal Grandfather   . Hyperlipidemia Paternal Grandmother   . Lung cancer Other        Paternal side Aunts & Uncles  . Bipolar disorder Maternal Uncle   . Schizophrenia Maternal Uncle   . Other Maternal Uncle        DDD  . Mental illness Maternal Uncle   . Healthy Brother   . ADD / ADHD Brother   . Depression Maternal Aunt   . Autism Neg  Hx     Medications:      No current outpatient medications on file.  Objective Blood pressure 120/79, pulse 97, temperature 98.9 F (37.2 C), height 5\' 3"  (1.6 m), weight 130 lb (59 kg), last menstrual period 12/09/2018, not currently breastfeeding.  General WDWN female NAD Vulva:  normal appearing vulva with no masses, tenderness or lesions Vagina:  normal mucosa, scant blood Cervix:  no cervical motion tenderness and no lesions Uterus:  normal size, contour, position, consistency, mobility, non-tender Adnexa: ovaries:present,  normal adnexa in size, nontender and no masses   Pertinent ROS No burning with urination, frequency or urgency No nausea, vomiting or diarrhea Nor fever chills or other constitutional symptoms   Labs or studies No new    Impression Diagnoses this Encounter::   ICD-10-CM   1. Complete miscarriage O03.9   2. Missed ab, twin pregnancy O02.1     Established relevant diagnosis(es):   Plan/Recommendations: No orders of the defined types were placed in this encounter.   Labs or Scans Ordered: No orders of the defined types were placed in this encounter.   Management:: >pt declines birth control t this time  Follow up Return if symptoms worsen or fail to improve.       All questions were answered.

## 2018-12-27 ENCOUNTER — Telehealth: Payer: Self-pay | Admitting: Women's Health

## 2018-12-27 ENCOUNTER — Telehealth: Payer: Self-pay | Admitting: *Deleted

## 2018-12-27 NOTE — Telephone Encounter (Signed)
Had missed Ab in March just did a preg test and in +, could it still be from that preg or is there a chance she is preg again. Pt would like a call from a nurse

## 2018-12-27 NOTE — Telephone Encounter (Signed)
Spoke with pt giving her Dr. Forestine Chute recommendations. Pt will come in Monday for quant. Call transferred to front desk for appt. JSY

## 2018-12-27 NOTE — Telephone Encounter (Signed)
Spoke with pt. Pt had miscarriage in March. Pt took a home pregnancy test today and it was positive. Pt wonders if it's still + from miscarriage or if this is a new pregnancy. Pt stopped bleeding after miscarriage. Pt started bleeding heavy again last week and then it stopped the next day. She hasn't had a normal period if this is a period. Please advise. JSY

## 2018-12-27 NOTE — Telephone Encounter (Signed)
Unclear  She will need quantitative HCG levels done  She can come Monday for lab only and we will base future labs on that value  Yes it could be still from the miscarriage hormone level but need HCG testing to say one way or the other

## 2018-12-27 NOTE — Telephone Encounter (Signed)
Pt has gotten married and medical info is in other chart. Encounter closed. JSY

## 2018-12-30 ENCOUNTER — Other Ambulatory Visit: Payer: Medicaid Other

## 2018-12-30 ENCOUNTER — Other Ambulatory Visit: Payer: Self-pay

## 2018-12-30 DIAGNOSIS — O021 Missed abortion: Secondary | ICD-10-CM | POA: Diagnosis not present

## 2018-12-30 NOTE — Addendum Note (Signed)
Addended by: Moss Mc on: 12/30/2018 10:44 AM   Modules accepted: Orders

## 2018-12-31 ENCOUNTER — Telehealth: Payer: Self-pay | Admitting: Obstetrics & Gynecology

## 2018-12-31 LAB — BETA HCG QUANT (REF LAB): hCG Quant: 22 m[IU]/mL

## 2018-12-31 NOTE — Telephone Encounter (Signed)
Patient called, looking for lab results from yesterday.  [phone]

## 2019-01-01 ENCOUNTER — Other Ambulatory Visit: Payer: Medicaid Other

## 2019-01-01 ENCOUNTER — Telehealth: Payer: Self-pay | Admitting: Obstetrics & Gynecology

## 2019-01-01 ENCOUNTER — Other Ambulatory Visit: Payer: Self-pay

## 2019-01-01 DIAGNOSIS — O200 Threatened abortion: Secondary | ICD-10-CM | POA: Diagnosis not present

## 2019-01-01 NOTE — Telephone Encounter (Signed)
Patient informed a second HCG needs to be drawn in order to determine if this is a miscarriage or a new pregnancy.  Patient states she will come later this afternoon.

## 2019-01-01 NOTE — Telephone Encounter (Signed)
Pt calling back again because she has not heard from [place] about her labs/ I advised her that the message was there for a nurse to call but she wanted me to send another note !!

## 2019-01-02 ENCOUNTER — Telehealth: Payer: Self-pay | Admitting: Obstetrics & Gynecology

## 2019-01-02 LAB — BETA HCG QUANT (REF LAB): hCG Quant: 16 m[IU]/mL

## 2019-01-02 NOTE — Telephone Encounter (Signed)
Pt requesting a call with her lab results from yesterday.

## 2019-01-02 NOTE — Telephone Encounter (Signed)
Patient informed this was residual from last pregnancy, HCG is continuing to fall. Can recheck in 2 weeks and make sure back to pre-pregnancy levels. Pt verbalized understanding and appt made.

## 2019-01-15 ENCOUNTER — Other Ambulatory Visit: Payer: Self-pay

## 2019-01-15 ENCOUNTER — Other Ambulatory Visit: Payer: Medicaid Other

## 2019-01-15 DIAGNOSIS — O039 Complete or unspecified spontaneous abortion without complication: Secondary | ICD-10-CM | POA: Diagnosis not present

## 2019-01-16 LAB — BETA HCG QUANT (REF LAB): hCG Quant: 3 m[IU]/mL

## 2019-01-17 ENCOUNTER — Telehealth: Payer: Self-pay | Admitting: Women's Health

## 2019-01-17 NOTE — Telephone Encounter (Signed)
Patient informed HCG 3.  Verbalized understanding.

## 2019-01-17 NOTE — Telephone Encounter (Signed)
Patient called, looking for lab results.  [phone]

## 2019-01-21 ENCOUNTER — Telehealth: Payer: Self-pay | Admitting: Women's Health

## 2019-01-21 NOTE — Telephone Encounter (Signed)
Pt states that she recently had a miscarriage and is experiencing some pain her side near her ovaries. She is wanting to see if this can be normal after a miscarriage.

## 2019-01-21 NOTE — Telephone Encounter (Signed)
Called patient back and informed her that the cramping is normal. She can take iburprofen as needed. Advised period may be coming back soon.

## 2019-05-15 ENCOUNTER — Other Ambulatory Visit: Payer: Self-pay

## 2019-05-15 ENCOUNTER — Emergency Department (HOSPITAL_COMMUNITY)
Admission: EM | Admit: 2019-05-15 | Discharge: 2019-05-15 | Disposition: A | Discharge status: Home / Self Care | Payer: BC Managed Care – PPO | Attending: Emergency Medicine | Admitting: Emergency Medicine

## 2019-05-15 ENCOUNTER — Emergency Department (HOSPITAL_COMMUNITY): Payer: BC Managed Care – PPO

## 2019-05-15 DIAGNOSIS — M25562 Pain in left knee: Secondary | ICD-10-CM | POA: Diagnosis not present

## 2019-05-15 DIAGNOSIS — Y999 Unspecified external cause status: Secondary | ICD-10-CM | POA: Diagnosis not present

## 2019-05-15 DIAGNOSIS — Y92410 Unspecified street and highway as the place of occurrence of the external cause: Secondary | ICD-10-CM | POA: Insufficient documentation

## 2019-05-15 DIAGNOSIS — S80912A Unspecified superficial injury of left knee, initial encounter: Secondary | ICD-10-CM | POA: Diagnosis not present

## 2019-05-15 DIAGNOSIS — Y93I9 Activity, other involving external motion: Secondary | ICD-10-CM | POA: Insufficient documentation

## 2019-05-15 DIAGNOSIS — I10 Essential (primary) hypertension: Secondary | ICD-10-CM | POA: Diagnosis not present

## 2019-05-15 DIAGNOSIS — S8002XA Contusion of left knee, initial encounter: Secondary | ICD-10-CM | POA: Diagnosis not present

## 2019-05-15 DIAGNOSIS — Z7722 Contact with and (suspected) exposure to environmental tobacco smoke (acute) (chronic): Secondary | ICD-10-CM | POA: Diagnosis not present

## 2019-05-15 DIAGNOSIS — S8012XA Contusion of left lower leg, initial encounter: Secondary | ICD-10-CM

## 2019-05-15 MED ORDER — IBUPROFEN 400 MG PO TABS
600.0000 mg | ORAL_TABLET | Freq: Once | ORAL | Status: AC
  Administered 2019-05-15: 600 mg via ORAL
  Filled 2019-05-15: qty 1
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
## 2019-05-15 NOTE — ED Notes (Signed)
Patient verbalizes understanding of discharge instructions. Opportunity for questioning and answers were provided. Armband removed by staff, pt discharged from ED ambulatory.   

## 2019-05-15 NOTE — Discharge Instructions (Signed)
Please read provided information and contusions and cold therapy.  Rest ice and elevate your left knee.  Return in for evaluation if you have any new or concerning symptoms.  Use ibuprofen and Tylenol for pain and swelling.
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
## 2019-08-26 DIAGNOSIS — Z3682 Encounter for antenatal screening for nuchal translucency: Secondary | ICD-10-CM | POA: Diagnosis not present

## 2019-08-26 DIAGNOSIS — Z1331 Encounter for screening for depression: Secondary | ICD-10-CM | POA: Diagnosis not present

## 2019-08-26 DIAGNOSIS — Z3201 Encounter for pregnancy test, result positive: Secondary | ICD-10-CM | POA: Diagnosis not present

## 2019-08-26 DIAGNOSIS — N912 Amenorrhea, unspecified: Secondary | ICD-10-CM | POA: Diagnosis not present

## 2019-08-26 DIAGNOSIS — O3680X0 Pregnancy with inconclusive fetal viability, not applicable or unspecified: Secondary | ICD-10-CM | POA: Diagnosis not present

## 2019-08-26 DIAGNOSIS — Z3A08 8 weeks gestation of pregnancy: Secondary | ICD-10-CM | POA: Diagnosis not present

## 2019-08-26 DIAGNOSIS — O26841 Uterine size-date discrepancy, first trimester: Secondary | ICD-10-CM | POA: Diagnosis not present

## 2019-09-23 DIAGNOSIS — Z3689 Encounter for other specified antenatal screening: Secondary | ICD-10-CM | POA: Diagnosis not present

## 2019-09-23 DIAGNOSIS — Z3682 Encounter for antenatal screening for nuchal translucency: Secondary | ICD-10-CM | POA: Diagnosis not present

## 2019-09-23 DIAGNOSIS — Z3A12 12 weeks gestation of pregnancy: Secondary | ICD-10-CM | POA: Diagnosis not present

## 2019-11-18 DIAGNOSIS — Z3A20 20 weeks gestation of pregnancy: Secondary | ICD-10-CM | POA: Diagnosis not present

## 2019-11-18 DIAGNOSIS — Z3689 Encounter for other specified antenatal screening: Secondary | ICD-10-CM | POA: Diagnosis not present

## 2020-03-02 IMAGING — DX DG KNEE COMPLETE 4+V*L*
4 series · 4 of 4 positions shown · non-contrast
Comparison: None.

CLINICAL DATA: Left knee pain

EXAM:
LEFT KNEE - COMPLETE 4+ VIEW

[knee ap]
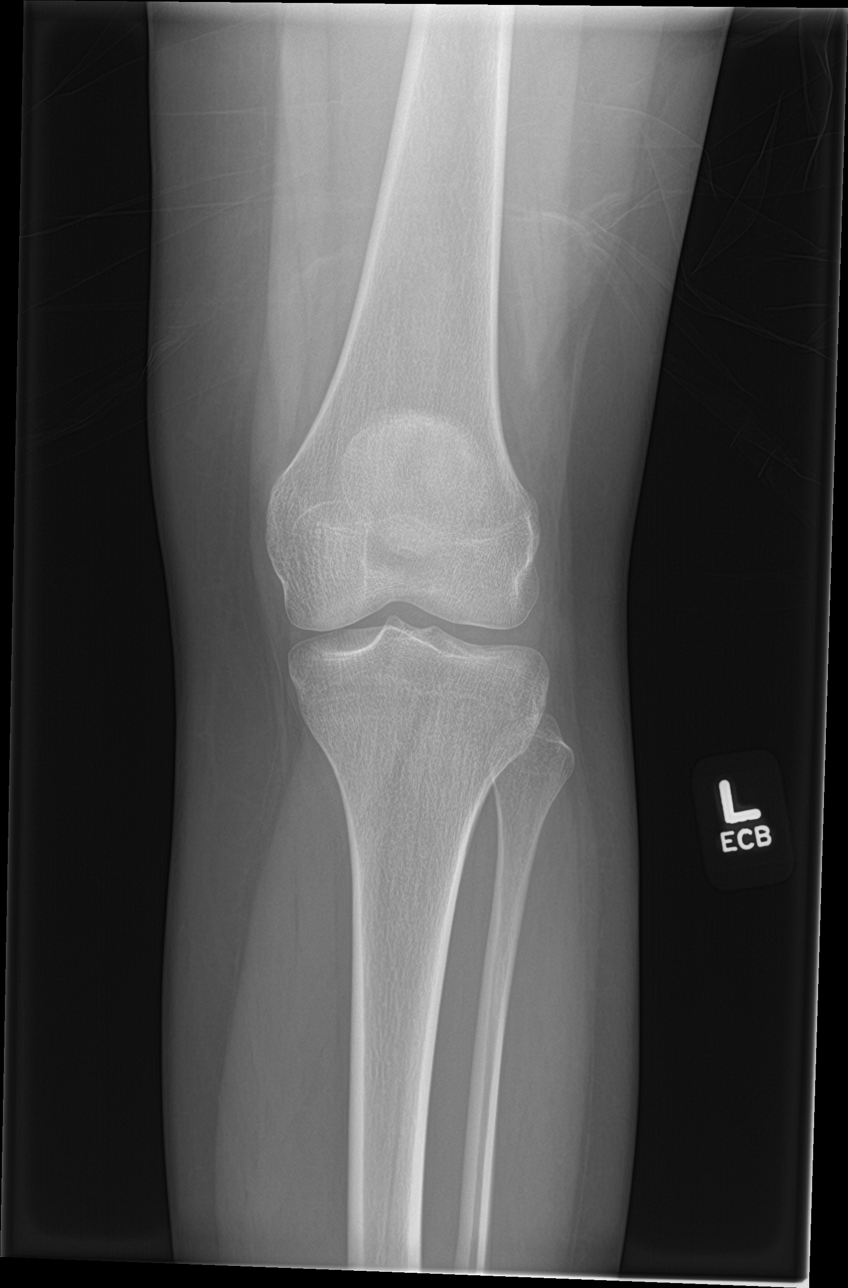

[knee lat]
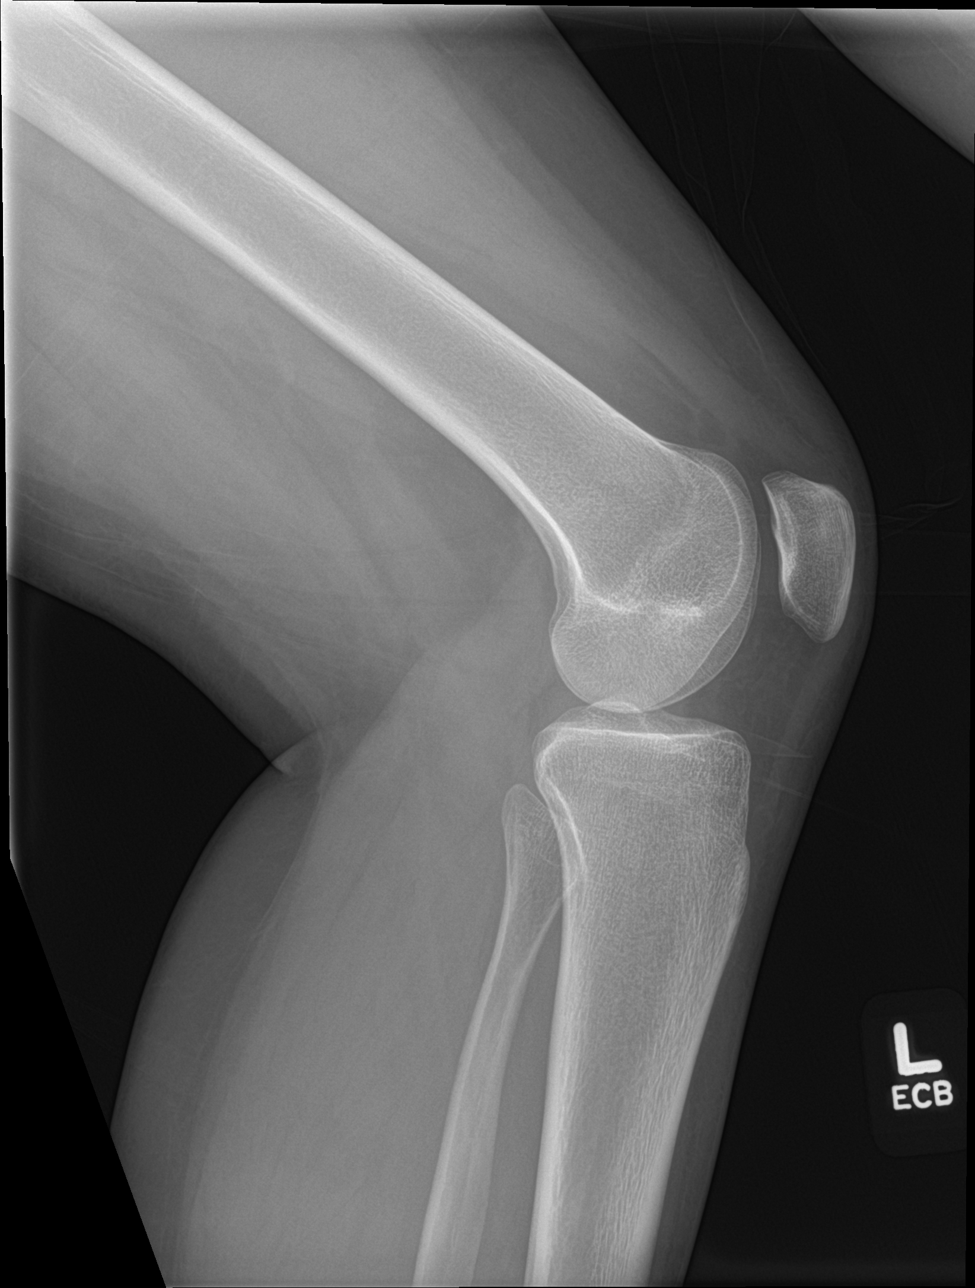

[knee obl (1 of 2)]
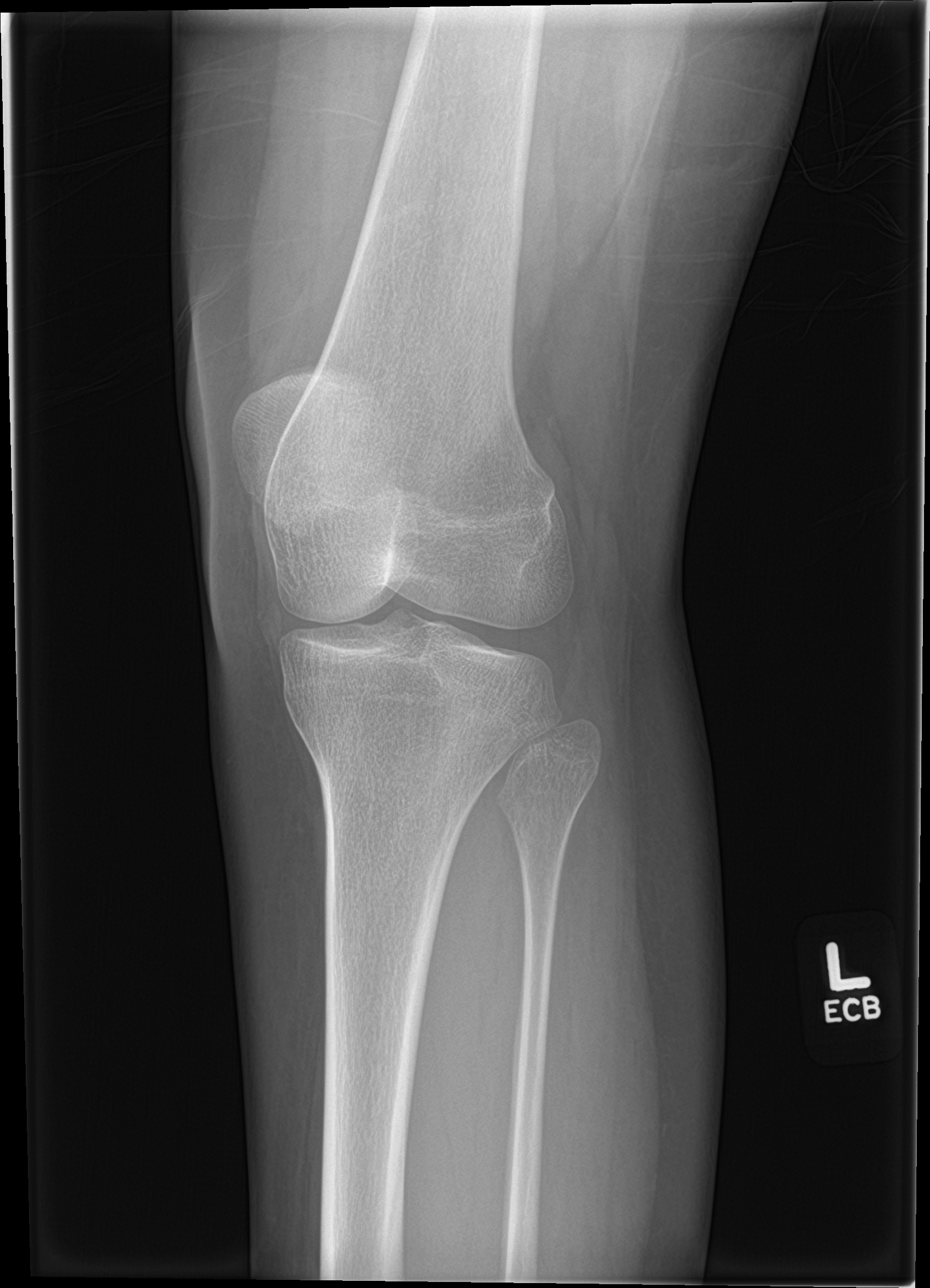

[knee obl (2 of 2)]
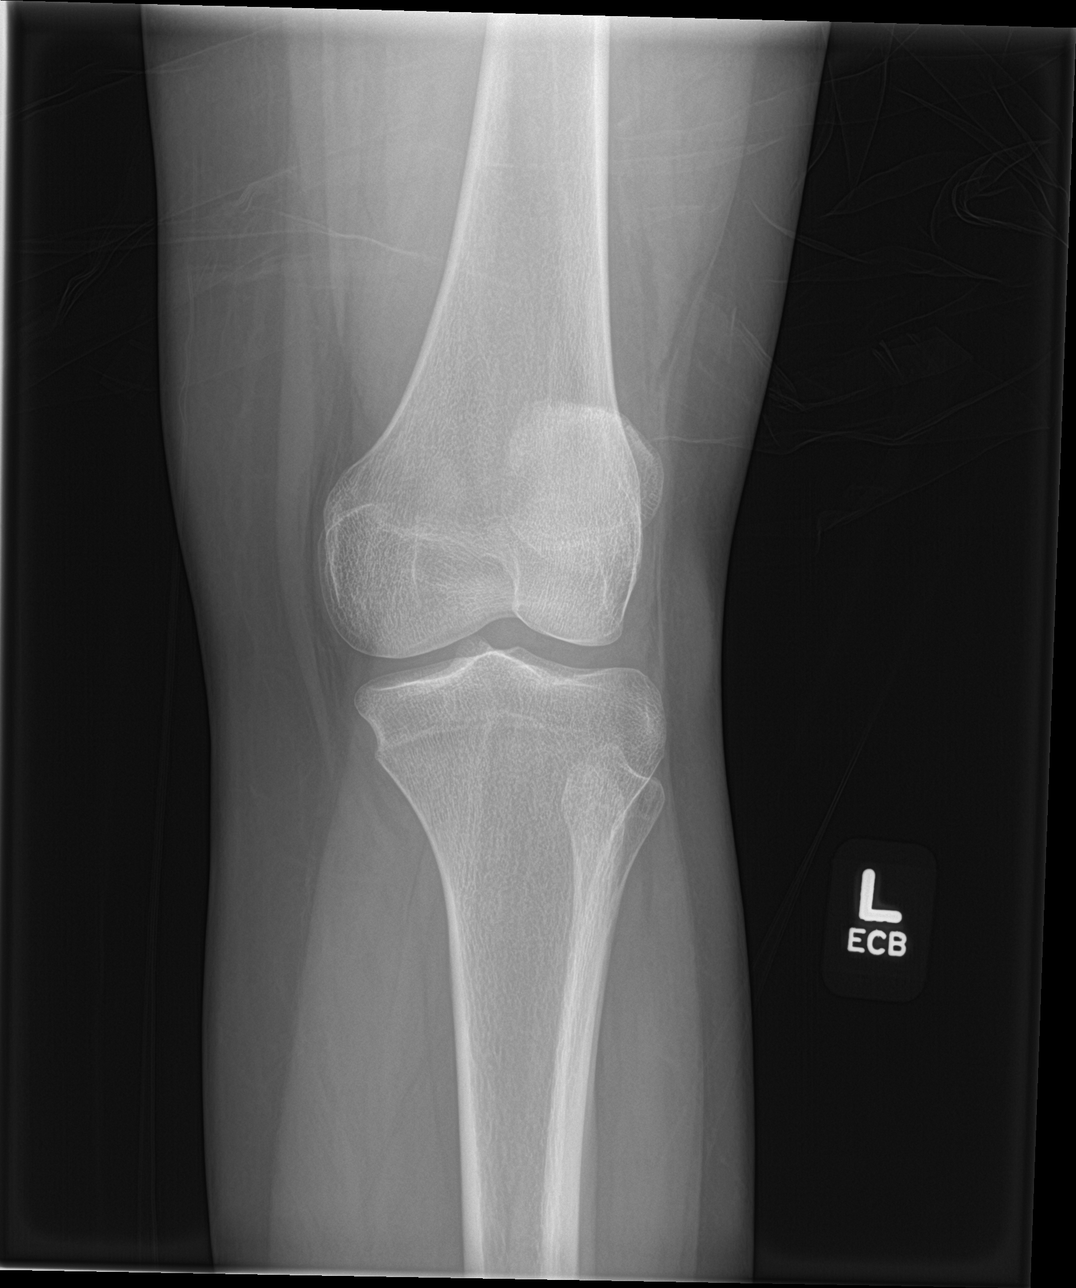

[4 of 4 positions shown; findings below may reference images not displayed]

FINDINGS: No fracture or dislocation of the left knee. Joint spaces are well
preserved. No knee joint effusion. Soft tissues are unremarkable.
IMPRESSION: No fracture or dislocation of the left knee. Joint spaces are well
preserved. No knee joint effusion. Soft tissues are unremarkable.

## 2021-07-19 LAB — CYTOLOGY - PAP: Pap Smear: NEGATIVE

## 2021-09-04 NOTE — L&D Delivery Note (Cosign Needed)
Delivery Note Patient delivered her own baby as they were pulling into front driveway of [facility] building. RN was present at [facility] immediately thereafter. Baby was wrapped in blanket and mother and baby were assisted into wheelchair.   Taken to MAU where we double-clamped cord and FOB cut the cord. Placenta delivered by me and perineum inspected, intact.   At 3:29 AM a viable and healthy female was delivered via Vaginal, Spontaneous  APGAR: , 9; weight pending.   Placenta status: Spontaneous, Intact.  Cord: 3 vessels with the following complications: None.    Anesthesia: None Episiotomy: None Lacerations: None Suture Repair:  none Est. Blood Loss (mL):  57  Mom to postpartum.  Baby to Couplet care / Skin to Skin.  Wynelle Bourgeois 09/01/2022, 4:51 AM

## 2021-10-07 ENCOUNTER — Ambulatory Visit (INDEPENDENT_AMBULATORY_CARE_PROVIDER_SITE_OTHER): Payer: Medicaid Other | Admitting: *Deleted

## 2021-10-07 ENCOUNTER — Encounter: Payer: Self-pay | Admitting: *Deleted

## 2021-10-07 ENCOUNTER — Other Ambulatory Visit: Payer: Self-pay

## 2021-10-07 VITALS — Ht 63.0 in | Wt 151.0 lb

## 2021-10-07 DIAGNOSIS — Z3202 Encounter for pregnancy test, result negative: Secondary | ICD-10-CM

## 2021-10-07 DIAGNOSIS — Z3201 Encounter for pregnancy test, result positive: Secondary | ICD-10-CM

## 2021-10-07 LAB — POCT URINE PREGNANCY: Preg Test, Ur: NEGATIVE

## 2021-10-07 NOTE — Progress Notes (Signed)
° °  NURSE VISIT- PREGNANCY CONFIRMATION   SUBJECTIVE:  April Murray is a 21 y.o. G68P1011 female at Unknown by uncertain LMP of Patient's last menstrual period was 09/18/2021. Here for pregnancy confirmation.  Home pregnancy test: positive x 2(faint)   She reports  spotting and cramping .  She is not taking prenatal vitamins.    OBJECTIVE:  Ht 5\' 3"  (1.6 m)    Wt 151 lb (68.5 kg)    LMP 09/18/2021    BMI 26.75 kg/m   Appears well, in no apparent distress  Results for orders placed or performed in visit on 10/07/21 (from the past 24 hour(s))  POCT urine pregnancy   Collection Time: 10/07/21 12:16 PM  Result Value Ref Range   Preg Test, Ur Negative Negative    ASSESSMENT: Positive pregnancy test, Unknown by LMP    PLAN: Schedule for dating ultrasound in pending quant results  Prenatal vitamins: pending quant results.   Nausea medicines: not currently needed   OB packet given: No  Levy Pupa  10/07/2021 12:26 PM

## 2021-10-08 LAB — BETA HCG QUANT (REF LAB): hCG Quant: 6 m[IU]/mL

## 2021-10-10 ENCOUNTER — Other Ambulatory Visit: Payer: Self-pay | Admitting: Adult Health

## 2021-10-10 ENCOUNTER — Telehealth: Payer: Self-pay | Admitting: Adult Health

## 2021-10-10 ENCOUNTER — Encounter: Payer: Self-pay | Admitting: *Deleted

## 2021-10-10 DIAGNOSIS — Z3201 Encounter for pregnancy test, result positive: Secondary | ICD-10-CM

## 2021-10-10 NOTE — Telephone Encounter (Signed)
Voice mail not set up @ 2:48 pm. JSY I also sent a MyChart message. JSY

## 2021-10-10 NOTE — Telephone Encounter (Signed)
Patient called stating that she seen on her Mychart that her blood work states she is a 6 and she would like to know does that mean she is pregnant. Please contact pt

## 2021-10-10 NOTE — Progress Notes (Signed)
Ck QHCG today or tomorrow

## 2021-10-11 LAB — BETA HCG QUANT (REF LAB): hCG Quant: <1 m[IU]/mL

## 2021-10-11 NOTE — Telephone Encounter (Signed)
Pt aware of quant results. Pt wonders what happened. Did she lose the baby that quick? Please advise. Thanks!! JSY

## 2021-10-11 NOTE — Telephone Encounter (Signed)
Discussed QHCG numbers with her

## 2021-10-26 ENCOUNTER — Other Ambulatory Visit (INDEPENDENT_AMBULATORY_CARE_PROVIDER_SITE_OTHER): Payer: Medicaid Other | Admitting: *Deleted

## 2021-10-26 ENCOUNTER — Other Ambulatory Visit: Payer: Self-pay

## 2021-10-26 DIAGNOSIS — R300 Dysuria: Secondary | ICD-10-CM

## 2021-10-26 DIAGNOSIS — R3915 Urgency of urination: Secondary | ICD-10-CM

## 2021-10-26 LAB — POCT URINALYSIS DIPSTICK OB
Blood, UA: NEGATIVE
Glucose, UA: NEGATIVE
Ketones, UA: NEGATIVE
Leukocytes, UA: NEGATIVE
Nitrite, UA: NEGATIVE
POC,PROTEIN,UA: NEGATIVE

## 2021-10-26 NOTE — Progress Notes (Signed)
° °  NURSE VISIT- UTI SYMPTOMS   SUBJECTIVE:  April Murray is a 21 y.o. G36P1011 female here for UTI symptoms. She is a GYN patient. She reports dysuria and urinary urgency.  OBJECTIVE:  There were no vitals taken for this visit.  Appears well, in no apparent distress  Results for orders placed or performed in visit on 10/26/21 (from the past 24 hour(s))  POC Urinalysis Dipstick OB   Collection Time: 10/26/21 10:28 AM  Result Value Ref Range   Color, UA     Clarity, UA     Glucose, UA Negative Negative   Bilirubin, UA     Ketones, UA neg    Spec Grav, UA     Blood, UA neg    pH, UA     POC,PROTEIN,UA Negative Negative, Trace, Small (1+), Moderate (2+), Large (3+), 4+   Urobilinogen, UA     Nitrite, UA neg    Leukocytes, UA Negative Negative   Appearance     Odor      ASSESSMENT: GYN patient with UTI symptoms and negative nitrites  PLAN: Note routed to Cyril Mourning, AGNP   Rx sent by provider today: No Urine culture sent Call or return to clinic prn if these symptoms worsen or fail to improve as anticipated. Follow-up: as needed   Annamarie Dawley  10/26/2021 10:28 AM

## 2021-10-27 LAB — URINALYSIS
Bilirubin, UA: NEGATIVE
Glucose, UA: NEGATIVE
Ketones, UA: NEGATIVE
Leukocytes,UA: NEGATIVE
Nitrite, UA: NEGATIVE
Protein,UA: NEGATIVE
RBC, UA: NEGATIVE
Specific Gravity, UA: 1.013 (ref 1.005–1.030)
Urobilinogen, Ur: 0.2 mg/dL (ref 0.2–1.0)
pH, UA: 7 (ref 5.0–7.5)

## 2021-10-29 LAB — URINE CULTURE

## 2022-01-02 ENCOUNTER — Ambulatory Visit (INDEPENDENT_AMBULATORY_CARE_PROVIDER_SITE_OTHER): Payer: Medicaid Other | Admitting: *Deleted

## 2022-01-02 VITALS — BP 128/79 | HR 104 | Wt 150.0 lb

## 2022-01-02 DIAGNOSIS — Z3201 Encounter for pregnancy test, result positive: Secondary | ICD-10-CM | POA: Diagnosis not present

## 2022-01-02 DIAGNOSIS — N926 Irregular menstruation, unspecified: Secondary | ICD-10-CM | POA: Diagnosis not present

## 2022-01-02 DIAGNOSIS — Z789 Other specified health status: Secondary | ICD-10-CM

## 2022-01-02 LAB — POCT URINE PREGNANCY: Preg Test, Ur: POSITIVE — AB

## 2022-01-02 NOTE — Progress Notes (Signed)
? ?  NURSE VISIT- PREGNANCY CONFIRMATION  ? ?SUBJECTIVE:  ?April Murray is a 21 y.o. G75P1011 female uncertain LMP here for pregnancy confirmation.  Home pregnancy test: positive x 3 .  Took a test because "she felt off".She reports no complaints.  She is not taking prenatal vitamins.   ? ?OBJECTIVE:  ?BP 128/79 (BP Location: Right Arm, Patient Position: Sitting, Cuff Size: Normal)   Pulse (!) 104   Wt 150 lb (68 kg)   LMP  (LMP Unknown)   Breastfeeding No   BMI 26.57 kg/m?   ?Appears well, in no apparent distress ? ?Results for orders placed or performed in visit on 01/02/22 (from the past 24 hour(s))  ?POCT urine pregnancy  ? Collection Time: 01/02/22 11:44 AM  ?Result Value Ref Range  ? Preg Test, Ur Positive (A) Negative  ? ? ?ASSESSMENT: ?Positive pregnancy test, Unknown by LMP   ? ?PLAN: ?HCG ordered. Schedule for dating ultrasound when labs back ?Prenatal vitamins: plans to start OTC   ?Nausea medicines: not currently needed   ?OB packet given: No, patient still has one from  ? ?Debbe Odea Crosby Bevan  ?01/02/2022 ?11:45 AM ? ?

## 2022-01-03 LAB — BETA HCG QUANT (REF LAB): hCG Quant: 579 m[IU]/mL

## 2022-01-23 ENCOUNTER — Ambulatory Visit (INDEPENDENT_AMBULATORY_CARE_PROVIDER_SITE_OTHER): Payer: Medicaid Other

## 2022-01-23 ENCOUNTER — Other Ambulatory Visit: Payer: Self-pay | Admitting: Obstetrics & Gynecology

## 2022-01-23 DIAGNOSIS — Z3A01 Less than 8 weeks gestation of pregnancy: Secondary | ICD-10-CM

## 2022-01-23 DIAGNOSIS — O3680X0 Pregnancy with inconclusive fetal viability, not applicable or unspecified: Secondary | ICD-10-CM | POA: Diagnosis not present

## 2022-01-23 NOTE — Progress Notes (Signed)
[place] 7+2 wks,single IUP with YS,positive FHT 158 bpm,CRL 11.34 mm,normal ovaries

## 2022-02-27 ENCOUNTER — Encounter: Payer: Self-pay | Admitting: Women's Health

## 2022-02-27 ENCOUNTER — Other Ambulatory Visit: Payer: Self-pay | Admitting: Obstetrics & Gynecology

## 2022-02-27 DIAGNOSIS — Z3490 Encounter for supervision of normal pregnancy, unspecified, unspecified trimester: Secondary | ICD-10-CM | POA: Insufficient documentation

## 2022-02-27 DIAGNOSIS — Z3682 Encounter for antenatal screening for nuchal translucency: Secondary | ICD-10-CM

## 2022-02-28 ENCOUNTER — Ambulatory Visit (INDEPENDENT_AMBULATORY_CARE_PROVIDER_SITE_OTHER): Payer: Medicaid Other

## 2022-02-28 ENCOUNTER — Ambulatory Visit: Payer: Medicaid Other | Admitting: *Deleted

## 2022-02-28 ENCOUNTER — Encounter: Payer: Self-pay | Admitting: Women's Health

## 2022-02-28 ENCOUNTER — Ambulatory Visit (INDEPENDENT_AMBULATORY_CARE_PROVIDER_SITE_OTHER): Payer: Medicaid Other | Admitting: Women's Health

## 2022-02-28 VITALS — BP 129/80 | HR 93 | Wt 146.0 lb

## 2022-02-28 DIAGNOSIS — Z3682 Encounter for antenatal screening for nuchal translucency: Secondary | ICD-10-CM

## 2022-02-28 DIAGNOSIS — Z3481 Encounter for supervision of other normal pregnancy, first trimester: Secondary | ICD-10-CM

## 2022-02-28 DIAGNOSIS — Z3A12 12 weeks gestation of pregnancy: Secondary | ICD-10-CM | POA: Diagnosis not present

## 2022-02-28 DIAGNOSIS — Z3480 Encounter for supervision of other normal pregnancy, unspecified trimester: Secondary | ICD-10-CM

## 2022-02-28 LAB — POCT URINALYSIS DIPSTICK OB
Blood, UA: NEGATIVE
Glucose, UA: NEGATIVE
Ketones, UA: NEGATIVE
Leukocytes, UA: NEGATIVE
Nitrite, UA: NEGATIVE
POC,PROTEIN,UA: NEGATIVE

## 2022-02-28 MED ORDER — BLOOD PRESSURE MONITOR MISC: 0 refills | Status: DC

## 2022-03-01 ENCOUNTER — Encounter: Payer: Self-pay | Admitting: *Deleted

## 2022-03-01 LAB — CBC/D/PLT+RPR+RH+ABO+RUBIGG...
Antibody Screen: NEGATIVE
Basophils Absolute: 0 10*3/uL (ref 0.0–0.2)
Basos: 0 %
EOS (ABSOLUTE): 0 10*3/uL (ref 0.0–0.4)
Eos: 0 %
HCV Ab: NONREACTIVE
HIV Screen 4th Generation wRfx: NONREACTIVE
Hematocrit: 39.2 % (ref 34.0–46.6)
Hemoglobin: 13.4 g/dL (ref 11.1–15.9)
Hepatitis B Surface Ag: NEGATIVE
Immature Grans (Abs): 0 10*3/uL (ref 0.0–0.1)
Immature Granulocytes: 0 %
Lymphocytes Absolute: 2.5 10*3/uL (ref 0.7–3.1)
Lymphs: 41 %
MCH: 30.7 pg (ref 26.6–33.0)
MCHC: 34.2 g/dL (ref 31.5–35.7)
MCV: 90 fL (ref 79–97)
Monocytes Absolute: 0.4 10*3/uL (ref 0.1–0.9)
Monocytes: 6 %
Neutrophils Absolute: 3.1 10*3/uL (ref 1.4–7.0)
Neutrophils: 53 %
Platelets: 281 10*3/uL (ref 150–450)
RBC: 4.37 x10E6/uL (ref 3.77–5.28)
RDW: 11.6 % — ABNORMAL LOW (ref 11.7–15.4)
RPR Ser Ql: NONREACTIVE
Rh Factor: POSITIVE
Rubella Antibodies, IGG: 2.5 index (ref >0.99)
WBC: 6 10*3/uL (ref 3.4–10.8)

## 2022-03-01 LAB — HCV INTERPRETATION

## 2022-03-02 LAB — URINE CULTURE

## 2022-03-02 LAB — GC/CHLAMYDIA PROBE AMP
Chlamydia trachomatis, NAA: NEGATIVE
Neisseria Gonorrhoeae by PCR: NEGATIVE

## 2022-03-07 LAB — PANORAMA PRENATAL TEST FULL PANEL:PANORAMA TEST PLUS 5 ADDITIONAL MICRODELETIONS: FETAL FRACTION: 9.2

## 2022-03-10 LAB — HORIZON CUSTOM: REPORT SUMMARY: NEGATIVE

## 2022-03-21 ENCOUNTER — Encounter (HOSPITAL_COMMUNITY): Payer: Self-pay | Admitting: Obstetrics and Gynecology

## 2022-03-21 ENCOUNTER — Inpatient Hospital Stay (HOSPITAL_COMMUNITY)
Admission: AD | Admit: 2022-03-21 | Discharge: 2022-03-21 | Disposition: A | Discharge status: Home / Self Care | Payer: Medicaid Other | Attending: Obstetrics and Gynecology | Admitting: Obstetrics and Gynecology

## 2022-03-21 DIAGNOSIS — W1830XA Fall on same level, unspecified, initial encounter: Secondary | ICD-10-CM | POA: Insufficient documentation

## 2022-03-21 DIAGNOSIS — Z3A15 15 weeks gestation of pregnancy: Secondary | ICD-10-CM | POA: Diagnosis not present

## 2022-03-21 DIAGNOSIS — W19XXXA Unspecified fall, initial encounter: Secondary | ICD-10-CM | POA: Diagnosis not present

## 2022-03-21 DIAGNOSIS — O26892 Other specified pregnancy related conditions, second trimester: Secondary | ICD-10-CM | POA: Diagnosis present

## 2022-03-21 DIAGNOSIS — Y939 Activity, unspecified: Secondary | ICD-10-CM | POA: Insufficient documentation

## 2022-03-21 LAB — URINALYSIS, ROUTINE W REFLEX MICROSCOPIC
Bilirubin Urine: NEGATIVE
Glucose, UA: NEGATIVE mg/dL
Hgb urine dipstick: NEGATIVE
Ketones, ur: 20 mg/dL — AB
Leukocytes,Ua: NEGATIVE
Nitrite: NEGATIVE
Protein, ur: NEGATIVE mg/dL
Specific Gravity, Urine: 1.025 (ref 1.005–1.030)
pH: 5 (ref 5.0–8.0)

## 2022-03-21 NOTE — MAU Provider Note (Signed)
History     CSN: [id number]  Arrival date and time: 03/21/22 1639   None     Chief Complaint  Patient presents with   Fall   20 y.o. G3P1011 at [redacted]w[redacted]d by LMP and confirmed by U/S. She presents to MAU after tripping and falling on her stomach and knees. She reports some soreness over her abdomen. Denies cramping, vaginal bleeding, leaking of fluids. She denies hitting her head and LOC. She is not a diabetic.     OB History     Gravida  3   Para  1   Term  1   Preterm      AB  1   Living  1      SAB  1   IAB      Ectopic      Multiple      Live Births  1           Past Medical History:  Diagnosis Date   Depression    Miscarriage    Scoliosis    Seasonal allergies    UTI (lower urinary tract infection)     Past Surgical History:  Procedure Laterality Date   TONSILLECTOMY AND ADENOIDECTOMY      Family History  Problem Relation Age of Onset   Asthma Mother    Syncope episode Mother    Seizures Mother    Bipolar disorder Mother    Depression Mother    Anxiety disorder Mother    Migraines Father    Healthy Brother    ADD / ADHD Brother    Depression Maternal Aunt    Bipolar disorder Maternal Uncle    Schizophrenia Maternal Uncle    Other Maternal Uncle        DDD   Mental illness Maternal Uncle    Arthritis Maternal Grandmother    Depression Maternal Grandmother    Stroke Maternal Grandfather    Arthritis Maternal Grandfather    Other Maternal Grandfather        DDD   Hypertension Maternal Grandfather    Hyperlipidemia Paternal Grandmother    Alcoholism Paternal Grandfather    Hypertension Paternal Grandfather    Bipolar disorder Other    COPD Other    Endometriosis Other    Polycystic ovary syndrome Other    Other Other        DDD   Irritable bowel syndrome Other    Other Other        Inter cystitis   Migraines Other    Lung cancer Other        Paternal side Aunts & Uncles   Autism Neg Hx     Social History   Tobacco  Use   Smoking status: Former    Types: Cigarettes    Passive exposure: Yes   Smokeless tobacco: Never   Tobacco comments:    Mother smokes in her room  Vaping Use   Vaping Use: Former  Substance Use Topics   Alcohol use: Not Currently    Comment: every 2 weeks   Drug use: Not Currently    Allergies:  Allergies  Allergen Reactions   Penicillins     Itching and swelling    No medications prior to admission.    Review of Systems  Constitutional:  Negative for chills, fatigue and fever.  Genitourinary:  Negative for vaginal bleeding and vaginal pain.   Physical Exam   Blood pressure (!) 100/59, pulse 96, temperature 98.5 F (36.9 C), temperature source  Oral, resp. rate 18, height 5\' 4"  (1.626 m), weight 65.8 kg, SpO2 99 %, not currently breastfeeding.  Physical Exam Constitutional:      General: She is not in acute distress.    Appearance: Normal appearance. She is not ill-appearing.  Cardiovascular:     Rate and Rhythm: Normal rate and regular rhythm.  Pulmonary:     Effort: Pulmonary effort is normal.  Abdominal:     General: There is no distension.     Palpations: Abdomen is soft.     Tenderness: There is no abdominal tenderness.  Skin:    General: Skin is warm and dry.     Findings: No bruising.  Neurological:     Mental Status: She is alert.     MAU Course  Procedures  MDM 20 y.o. G3P1011 at [redacted]w[redacted]d by LMP and confirmed by U/S. She presents to MAU after tripping and falling on her stomach and knees. She reports some soreness over her abdomen. Denies cramping, vaginal bleeding, leaking of fluids. Physical exam was unremarkable with no obvious injury to the abdomen. Bedside U/S showed good fetal movement. RN doppler had fetal HR 150s. UA negative. Cervical exam was closed and thick. She is following up with her OB office on the 25th.   Assessment and Plan   1. Fall, initial encounter   2. [redacted] weeks gestation of pregnancy    Fall encounter:  Good fetal  movement on bedside U/S, fetal heart tones 150s, cervical exam normal.  - follow up with OB provider outpatient   Return Precautions: vaginal bleeding, leaking of fluids, severe abdominal cramping.   26 03/21/2022, 5:44 PM

## 2022-03-21 NOTE — Discharge Instructions (Signed)

## 2022-03-21 NOTE — MAU Note (Addendum)
.  April Murray is a 21 y.o. at [redacted]w[redacted]d here in MAU reporting: that she was walking and fell on her abdomen around 1540. Denies dizziness or lightheadedness at time of the fall, reports that she just tripped on the gravel. She reports feeling throbbing pain on both sides of her abdomen since the fall (5/10). Denies pain anywhere else but does report that her knees are scraped up. Denies VB or LOF.   Onset of complaint: today Pain score: 5/10 Vitals:   03/21/22 1654  Pulse: 94  Resp: 18  Temp: 98.5 F (36.9 C)  SpO2: 99%     FHT:158  Lab orders placed from triage:  UA

## 2022-03-21 NOTE — MAU Provider Note (Signed)
History     CSN: [id number]  Arrival date and time: 03/21/22 1639   Event Date/Time   First Provider Initiated Contact with Patient 03/21/22 1707      Chief Complaint  Patient presents with   Fall   HPI  April Murray is a 21 y.o. G3P1011 at [redacted]w[redacted]d who presents for evaluation of fall. Patient reports she tripped walking outside and fell on her knees and abdomen. She reports some general soreness on the sides of her abdomen since the fall. Patient rates the pain as a 2/10 and has not tried anything for the pain.   She denies any vaginal bleeding, discharge, and leaking of fluid. Denies any constipation, diarrhea or any urinary complaints. Reports normal fetal movement.   OB History     Gravida  3   Para  1   Term  1   Preterm      AB  1   Living  1      SAB  1   IAB      Ectopic      Multiple      Live Births  1           Past Medical History:  Diagnosis Date   Depression    Miscarriage    Scoliosis    Seasonal allergies    UTI (lower urinary tract infection)     Past Surgical History:  Procedure Laterality Date   TONSILLECTOMY AND ADENOIDECTOMY      Family History  Problem Relation Age of Onset   Asthma Mother    Syncope episode Mother    Seizures Mother    Bipolar disorder Mother    Depression Mother    Anxiety disorder Mother    Migraines Father    Healthy Brother    ADD / ADHD Brother    Depression Maternal Aunt    Bipolar disorder Maternal Uncle    Schizophrenia Maternal Uncle    Other Maternal Uncle        DDD   Mental illness Maternal Uncle    Arthritis Maternal Grandmother    Depression Maternal Grandmother    Stroke Maternal Grandfather    Arthritis Maternal Grandfather    Other Maternal Grandfather        DDD   Hypertension Maternal Grandfather    Hyperlipidemia Paternal Grandmother    Alcoholism Paternal Grandfather    Hypertension Paternal Grandfather    Bipolar disorder Other    COPD Other    Endometriosis  Other    Polycystic ovary syndrome Other    Other Other        DDD   Irritable bowel syndrome Other    Other Other        Inter cystitis   Migraines Other    Lung cancer Other        Paternal side Aunts & Uncles   Autism Neg Hx     Social History   Tobacco Use   Smoking status: Former    Types: Cigarettes    Passive exposure: Yes   Smokeless tobacco: Never   Tobacco comments:    Mother smokes in her room  Vaping Use   Vaping Use: Former  Substance Use Topics   Alcohol use: Not Currently    Comment: every 2 weeks   Drug use: Not Currently    Allergies:  Allergies  Allergen Reactions   Penicillins     Itching and swelling    No medications prior to admission.  Review of Systems  Constitutional: Negative.  Negative for fatigue and fever.  HENT: Negative.    Respiratory: Negative.  Negative for shortness of breath.   Cardiovascular: Negative.  Negative for chest pain.  Gastrointestinal:  Positive for abdominal pain. Negative for constipation, diarrhea, nausea and vomiting.  Genitourinary: Negative.  Negative for dysuria, vaginal bleeding and vaginal discharge.  Neurological: Negative.  Negative for dizziness and headaches.   Physical Exam   Blood pressure (!) 100/59, pulse 96, temperature 98.5 F (36.9 C), temperature source Oral, resp. rate 18, height 5\' 4"  (1.626 m), weight 65.8 kg, SpO2 99 %, not currently breastfeeding.  Patient Vitals for the past 24 hrs:  BP Temp Temp src Pulse Resp SpO2 Height Weight  03/21/22 1736 (!) 100/59 -- -- 96 -- -- -- --  03/21/22 1705 (!) 100/56 -- -- 95 -- -- -- --  03/21/22 1656 114/69 -- -- (!) 101 -- -- -- --  03/21/22 1654 -- 98.5 F (36.9 C) Oral 94 18 99 % 5\' 4"  (1.626 m) 65.8 kg    Physical Exam Vitals and nursing note reviewed.  Constitutional:      General: She is not in acute distress.    Appearance: She is well-developed.  HENT:     Head: Normocephalic.  Eyes:     Pupils: Pupils are equal, round, and  reactive to light.  Cardiovascular:     Rate and Rhythm: Normal rate and regular rhythm.     Heart sounds: Normal heart sounds.  Pulmonary:     Effort: Pulmonary effort is normal. No respiratory distress.     Breath sounds: Normal breath sounds.  Abdominal:     General: Bowel sounds are normal. There is no distension.     Palpations: Abdomen is soft.     Tenderness: There is no abdominal tenderness.  Skin:    General: Skin is warm and dry.  Neurological:     Mental Status: She is alert and oriented to person, place, and time.  Psychiatric:        Mood and Affect: Mood normal.        Behavior: Behavior normal.        Thought Content: Thought content normal.        Judgment: Judgment normal.     FHT: 158 bpm   Dilation: Closed Effacement (%): Thick Exam by:: , DO   MAU Course  Procedures  Results for orders placed or performed during the hospital encounter of 03/21/22 (from the past 24 hour(s))  Urinalysis, Routine w reflex microscopic     Status: Abnormal   Collection Time: 03/21/22  5:06 PM  Result Value Ref Range   Color, Urine YELLOW YELLOW   APPearance HAZY (A) CLEAR   Specific Gravity, Urine 1.025 1.005 - 1.030   pH 5.0 5.0 - 8.0   Glucose, UA NEGATIVE NEGATIVE mg/dL   Hgb urine dipstick NEGATIVE NEGATIVE   Bilirubin Urine NEGATIVE NEGATIVE   Ketones, ur 20 (A) NEGATIVE mg/dL   Protein, ur NEGATIVE NEGATIVE mg/dL   Nitrite NEGATIVE NEGATIVE   Leukocytes,Ua NEGATIVE NEGATIVE    MDM  Labs ordered and reviewed.   UA  Pt informed that the ultrasound is considered a limited OB ultrasound and is not intended to be a complete ultrasound exam.  Patient also informed that the ultrasound is not being completed with the intent of assessing for fetal or placental anomalies or any pelvic abnormalities.  Explained that the purpose of today's ultrasound is to assess  for  viability.  Patient acknowledges the purpose of the exam and the limitations of the study.     Active fetus with FHR 155 bpm- patient reports feeling reassured seeing baby   Assessment and Plan   1. Fall, initial encounter   2. [redacted] weeks gestation of pregnancy     -Discharge home in stable condition -Second trimester precautions discussed -Patient advised to follow-up with OB as scheduled for prenatal care -Patient may return to MAU as needed or if her condition were to change or worsen  Rolm Bookbinder, CNM 03/21/2022, 6:45 PM

## 2022-03-28 ENCOUNTER — Ambulatory Visit (INDEPENDENT_AMBULATORY_CARE_PROVIDER_SITE_OTHER): Payer: Medicaid Other | Admitting: Women's Health

## 2022-03-28 ENCOUNTER — Encounter: Payer: Self-pay | Admitting: Women's Health

## 2022-03-28 VITALS — BP 111/67 | HR 85 | Wt 153.0 lb

## 2022-03-28 DIAGNOSIS — Z3480 Encounter for supervision of other normal pregnancy, unspecified trimester: Secondary | ICD-10-CM

## 2022-03-28 DIAGNOSIS — Z3482 Encounter for supervision of other normal pregnancy, second trimester: Secondary | ICD-10-CM

## 2022-03-28 DIAGNOSIS — Z363 Encounter for antenatal screening for malformations: Secondary | ICD-10-CM

## 2022-03-28 NOTE — Patient Instructions (Signed)
April Murray, thank you for choosing our office today! We appreciate the opportunity to meet your healthcare needs. You may receive a short survey by mail, e-mail, or through MyChart. If you are happy with your care we would appreciate if you could take just a few minutes to complete the survey questions. We read all of your comments and take your feedback very seriously. Thank you again for choosing our office.  [facility] Team at [facility] ([address] [place], [place] [address]) Entrance C, located off of [address] Free 24/7 valet parking  Go to [email or web address] to register for FREE online childbirth classes  Call the office ([phone]) or go to [facility] if: You begin to severe cramping Your water breaks.  Sometimes it is a big gush of fluid, sometimes it is just a trickle that keeps getting your panties wet or running down your legs You have vaginal bleeding.  It is normal to have a small amount of spotting if your cervix was checked.   [place] Pediatricians/Family Doctors [facility] ([facility]): [address], [phone]           [facility]: [address], [phone]                [facility] ([facility]): [address], [phone] (call to ask if accepting patients) [facility]: [address], [place], [phone]    [place] Pediatricians/Family Doctors [facility] ([facility]): [address], [phone] [facility]: [address], [phone] [facility]: [address], [phone]  [place] [facility]  [facility] ([facility]): [phone] [facility]: [address], [phone]   [place] [facility]: [address], [phone]  [place] [facility]  [facility]: [address], [phone]  Home Blood Pressure Monitoring for Patients   Your provider has recommended that you check your blood pressure (BP) at least once a week at home. If you do not have a blood pressure cuff at home, one will be provided for you. Contact your provider if you have not received your monitor within 1 week.   Helpful Tips for Accurate Home Blood Pressure Checks  Don't smoke, exercise, or drink caffeine 30 minutes before checking your BP Use the restroom before checking your BP (a full bladder can raise your pressure) Relax in a comfortable upright chair Feet on the ground Left arm resting comfortably on a flat surface at the level of your heart Legs uncrossed Back supported Sit quietly and don't talk Place the cuff on your bare arm Adjust snuggly, so that only two fingertips can fit between your skin and the top of the cuff Check 2 readings separated by at least one minute Keep a log of your BP readings For a visual, please reference this diagram: http://ccnc.care/bpdiagram  Provider Name: [facility]     Phone: [phone]  Zone 1: ALL CLEAR  Continue to monitor your symptoms:  BP reading is less than 140 (top number) or less than 90 (bottom number)  No right upper stomach pain No headaches or seeing spots No feeling nauseated or throwing up No swelling in face and hands  Zone 2: CAUTION Call your doctor's office for any of the following:  BP reading is greater than 140 (top number) or greater than   90 (bottom number)  Stomach pain under your ribs in the middle or right side Headaches or seeing spots Feeling nauseated or throwing up Swelling in face and hands  Zone 3: EMERGENCY  Seek immediate medical care if you have any of the following:  BP reading is greater than160 (top number) or greater than 110 (bottom number) Severe headaches not improving with Tylenol Serious difficulty catching your breath Any worsening symptoms from  Zone 2     Second Trimester of Pregnancy The second trimester is from week 14 through week 27 (months 4 through 6). The second trimester is often a time when you feel your best. Your body has adjusted to being pregnant, and you begin to feel better physically. Usually, morning sickness has lessened or quit completely, you may have more energy, and you may have an increase in appetite. The second trimester is also a time when the fetus is growing rapidly. At the end of the sixth month, the fetus is about 9 inches long and weighs about 1 pounds. You will likely begin to feel the baby move (quickening) between 16 and 20 weeks of pregnancy. Body changes during your second trimester Your body continues to go through many changes during your second trimester. The changes vary from woman to woman. Your weight will continue to increase. You will notice your lower abdomen bulging out. You may begin to get stretch marks on your hips, abdomen, and breasts. You may develop headaches that can be relieved by medicines. The medicines should be approved by your health care provider. You may urinate more often because the fetus is pressing on your bladder. You may develop or continue to have heartburn as a result of your pregnancy. You may develop constipation because certain hormones are causing the muscles that push waste through your intestines to slow down. You may develop hemorrhoids or swollen, bulging veins (varicose veins). You may have back pain. This is caused by: Weight gain. Pregnancy hormones that are relaxing the joints in your pelvis. A shift in weight and the muscles that support your balance. Your breasts will continue to grow and they will continue to become tender. Your gums may bleed and may be sensitive to brushing and flossing. Dark spots or blotches (chloasma, mask of pregnancy) may develop on your face. This will likely fade after the baby is born. A dark line from your belly button to  the pubic area (linea nigra) may appear. This will likely fade after the baby is born. You may have changes in your hair. These can include thickening of your hair, rapid growth, and changes in texture. Some women also have hair loss during or after pregnancy, or hair that feels dry or thin. Your hair will most likely return to normal after your baby is born.  What to expect at prenatal visits During a routine prenatal visit: You will be weighed to make sure you and the fetus are growing normally. Your blood pressure will be taken. Your abdomen will be measured to track your baby's growth. The fetal heartbeat will be listened to. Any test results from the previous visit will be discussed.  Your health care provider may ask you: How you are feeling. If you are feeling the baby move. If you have had any abnormal symptoms, such as leaking fluid, bleeding, severe headaches, or abdominal cramping. If you are using any tobacco products, including cigarettes, chewing tobacco, and electronic cigarettes. If you have any questions.  Other tests that may be performed during   your second trimester include: Blood tests that check for: Low iron levels (anemia). High blood sugar that affects pregnant women (gestational diabetes) between 24 and 28 weeks. Rh antibodies. This is to check for a protein on red blood cells (Rh factor). Urine tests to check for infections, diabetes, or protein in the urine. An ultrasound to confirm the proper growth and development of the baby. An amniocentesis to check for possible genetic problems. Fetal screens for spina bifida and Down syndrome. HIV (human immunodeficiency virus) testing. Routine prenatal testing includes screening for HIV, unless you choose not to have this test.  Follow these instructions at home: Medicines Follow your health care provider's instructions regarding medicine use. Specific medicines may be either safe or unsafe to take during  pregnancy. Take a prenatal vitamin that contains at least 600 micrograms (mcg) of folic acid. If you develop constipation, try taking a stool softener if your health care provider approves. Eating and drinking Eat a balanced diet that includes fresh fruits and vegetables, whole grains, good sources of protein such as meat, eggs, or tofu, and low-fat dairy. Your health care provider will help you determine the amount of weight gain that is right for you. Avoid raw meat and uncooked cheese. These carry germs that can cause birth defects in the baby. If you have low calcium intake from food, talk to your health care provider about whether you should take a daily calcium supplement. Limit foods that are high in fat and processed sugars, such as fried and sweet foods. To prevent constipation: Drink enough fluid to keep your urine clear or pale yellow. Eat foods that are high in fiber, such as fresh fruits and vegetables, whole grains, and beans. Activity Exercise only as directed by your health care provider. Most women can continue their usual exercise routine during pregnancy. Try to exercise for 30 minutes at least 5 days a week. Stop exercising if you experience uterine contractions. Avoid heavy lifting, wear low heel shoes, and practice good posture. A sexual relationship may be continued unless your health care provider directs you otherwise. Relieving pain and discomfort Wear a good support bra to prevent discomfort from breast tenderness. Take warm sitz baths to soothe any pain or discomfort caused by hemorrhoids. Use hemorrhoid cream if your health care provider approves. Rest with your legs elevated if you have leg cramps or low back pain. If you develop varicose veins, wear support hose. Elevate your feet for 15 minutes, 3-4 times a day. Limit salt in your diet. Prenatal Care Write down your questions. Take them to your prenatal visits. Keep all your prenatal visits as told by your health  care provider. This is important. Safety Wear your seat belt at all times when driving. Make a list of emergency phone numbers, including numbers for family, friends, the hospital, and police and fire departments. General instructions Ask your health care provider for a referral to a local prenatal education class. Begin classes no later than the beginning of month 6 of your pregnancy. Ask for help if you have counseling or nutritional needs during pregnancy. Your health care provider can offer advice or refer you to specialists for help with various needs. Do not use hot tubs, steam rooms, or saunas. Do not douche or use tampons or scented sanitary pads. Do not cross your legs for long periods of time. Avoid cat litter boxes and soil used by cats. These carry germs that can cause birth defects in the baby and possibly loss of the   fetus by miscarriage or stillbirth. Avoid all smoking, herbs, alcohol, and unprescribed drugs. Chemicals in these products can affect the formation and growth of the baby. Do not use any products that contain nicotine or tobacco, such as cigarettes and e-cigarettes. If you need help quitting, ask your health care provider. Visit your dentist if you have not gone yet during your pregnancy. Use a soft toothbrush to brush your teeth and be gentle when you floss. Contact a health care provider if: You have dizziness. You have mild pelvic cramps, pelvic pressure, or nagging pain in the abdominal area. You have persistent nausea, vomiting, or diarrhea. You have a bad smelling vaginal discharge. You have pain when you urinate. Get help right away if: You have a fever. You are leaking fluid from your vagina. You have spotting or bleeding from your vagina. You have severe abdominal cramping or pain. You have rapid weight gain or weight loss. You have shortness of breath with chest pain. You notice sudden or extreme swelling of your face, hands, ankles, feet, or legs. You  have not felt your baby move in over an hour. You have severe headaches that do not go away when you take medicine. You have vision changes. Summary The second trimester is from week 14 through week 27 (months 4 through 6). It is also a time when the fetus is growing rapidly. Your body goes through many changes during pregnancy. The changes vary from woman to woman. Avoid all smoking, herbs, alcohol, and unprescribed drugs. These chemicals affect the formation and growth your baby. Do not use any tobacco products, such as cigarettes, chewing tobacco, and e-cigarettes. If you need help quitting, ask your health care provider. Contact your health care provider if you have any questions. Keep all prenatal visits as told by your health care provider. This is important. This information is not intended to replace advice given to you by your health care provider. Make sure you discuss any questions you have with your health care provider. Document Released: 08/15/2001 Document Revised: 01/27/2016 Document Reviewed: 10/22/2012 [employer] Interactive Patient Education  2017 [employer]  

## 2022-03-28 NOTE — Progress Notes (Signed)
LOW-RISK PREGNANCY VISIT Patient name: April Murray MRN [id number]  Date of birth: [birth year] Chief Complaint:   Routine Prenatal Visit  History of Present Illness:   April Murray is a 21 y.o. G2P1011 female at [redacted]w[redacted]d with an Estimated Date of Delivery: 09/09/22 being seen today for ongoing management of a low-risk pregnancy.   Today she reports no complaints. Contractions: Not present. Vag. Bleeding: None.  Movement: Absent. denies leaking of fluid.     02/28/2022    2:07 PM 11/07/2016   11:06 AM  Depression screen PHQ 2/9  Decreased Interest 0 3  Down, Depressed, Hopeless 0 3  PHQ - 2 Score 0 6  Altered sleeping 1 0  Tired, decreased energy 1 3  Change in appetite 1 3  Feeling bad or failure about yourself  0 3  Trouble concentrating 0 0  Moving slowly or fidgety/restless 0 0  Suicidal thoughts 0 0  PHQ-9 Score 3 15        02/28/2022    2:08 PM  GAD 7 : Generalized Anxiety Score  Nervous, Anxious, on Edge 1  Control/stop worrying 1  Worry too much - different things 1  Trouble relaxing 1  Restless 0  Easily annoyed or irritable 1  Afraid - awful might happen 0  Total GAD 7 Score 5      Review of Systems:   Pertinent items are noted in HPI Denies abnormal vaginal discharge w/ itching/odor/irritation, headaches, visual changes, shortness of breath, chest pain, abdominal pain, severe nausea/vomiting, or problems with urination or bowel movements unless otherwise stated above. Pertinent History Reviewed:  Reviewed past medical,surgical, social, obstetrical and family history.  Reviewed problem list, medications and allergies. Physical Assessment:   Vitals:   03/28/22 1059  BP: 111/67  Pulse: 85  Weight: 153 lb (69.4 kg)  Body mass index is 26.26 kg/m.        Physical Examination:   General appearance: Well appearing, and in no distress  Mental status: Alert, oriented to person, place, and time  Skin: Warm & dry  Cardiovascular: Normal heart rate  noted  Respiratory: Normal respiratory effort, no distress  Abdomen: Soft, gravid, nontender  Pelvic: Cervical exam deferred         Extremities: Edema: None  Fetal Status: Fetal Heart Rate (bpm): 150   Movement: Absent    Chaperone: N/A   No results found for this or any previous visit (from the past 24 hour(s)).  Assessment & Plan:  1) Low-risk pregnancy G3P1011 at [redacted]w[redacted]d with an Estimated Date of Delivery: 09/09/22     Meds: No orders of the defined types were placed in this encounter.  Labs/procedures today: AFP-OSB  Plan:  Continue routine obstetrical care  Next visit: prefers will be in person for u/s    Reviewed: Preterm labor symptoms and general obstetric precautions including but not limited to vaginal bleeding, contractions, leaking of fluid and fetal movement were reviewed in detail with the patient.  All questions were answered. Does have home bp cuff. Office bp cuff given: not applicable. Check bp weekly, let us know if consistently >140 and/or >90.  Follow-up: Return for As scheduled.  Future Appointments  Date Time Provider Department Center  04/18/2022  3:00 PM [facility] [place] [facility] None  04/18/2022  3:50 PM Ayden Apodaca, Merlene Laughter, CNM [facility]    Orders Placed This Encounter  Procedures  . US OB Comp + 14 Wk  . AFP, Serum, Open Spina Bifida  Cheral Marker CNM, [facility] 03/28/2022 11:41 AM

## 2022-03-30 LAB — AFP, SERUM, OPEN SPINA BIFIDA
AFP MoM: 2.01
AFP Value: 60.4 ng/mL
Gest. Age on Collection Date: 15 weeks
Maternal Age At EDD: 23.4 yr
OSBR Risk 1 IN: 788
Test Results:: NEGATIVE
Weight: 152 [lb_av]

## 2022-04-14 ENCOUNTER — Other Ambulatory Visit: Payer: Medicaid Other

## 2022-04-18 ENCOUNTER — Encounter: Payer: Medicaid Other | Admitting: Women's Health

## 2022-04-18 ENCOUNTER — Other Ambulatory Visit: Payer: Medicaid Other

## 2022-04-27 ENCOUNTER — Encounter: Payer: Self-pay | Admitting: Obstetrics & Gynecology

## 2022-04-27 ENCOUNTER — Ambulatory Visit (INDEPENDENT_AMBULATORY_CARE_PROVIDER_SITE_OTHER): Payer: Medicaid Other | Admitting: Obstetrics & Gynecology

## 2022-04-27 ENCOUNTER — Ambulatory Visit (INDEPENDENT_AMBULATORY_CARE_PROVIDER_SITE_OTHER): Payer: Medicaid Other

## 2022-04-27 VITALS — BP 113/75 | HR 99 | Wt 148.2 lb

## 2022-04-27 DIAGNOSIS — Z3A20 20 weeks gestation of pregnancy: Secondary | ICD-10-CM | POA: Diagnosis not present

## 2022-04-27 DIAGNOSIS — Z363 Encounter for antenatal screening for malformations: Secondary | ICD-10-CM | POA: Diagnosis not present

## 2022-04-27 DIAGNOSIS — Z3480 Encounter for supervision of other normal pregnancy, unspecified trimester: Secondary | ICD-10-CM

## 2022-04-27 DIAGNOSIS — Z3482 Encounter for supervision of other normal pregnancy, second trimester: Secondary | ICD-10-CM | POA: Diagnosis not present

## 2022-04-27 DIAGNOSIS — Z3402 Encounter for supervision of normal first pregnancy, second trimester: Secondary | ICD-10-CM

## 2022-04-27 NOTE — Progress Notes (Signed)
   LOW-RISK PREGNANCY VISIT Patient name: April Murray MRN [id number]  Date of birth: 11-14-2000 Chief Complaint:   Routine Prenatal Visit  History of Present Illness:   April Murray is a 21 y.o. G52P1011 female at [redacted]w[redacted]d with an Estimated Date of Delivery: 09/09/22 being seen today for ongoing management of a low-risk pregnancy.      02/28/2022    2:07 PM 11/07/2016   11:06 AM  Depression screen PHQ 2/9  Decreased Interest 0 3  Down, Depressed, Hopeless 0 3  PHQ - 2 Score 0 6  Altered sleeping 1 0  Tired, decreased energy 1 3  Change in appetite 1 3  Feeling bad or failure about yourself  0 3  Trouble concentrating 0 0  Moving slowly or fidgety/restless 0 0  Suicidal thoughts 0 0  PHQ-9 Score 3 15    Today she reports no complaints. Contractions: Not present. Vag. Bleeding: None.  Movement: Present. denies leaking of fluid. Review of Systems:   Pertinent items are noted in HPI Denies abnormal vaginal discharge w/ itching/odor/irritation, headaches, visual changes, shortness of breath, chest pain, abdominal pain, severe nausea/vomiting, or problems with urination or bowel movements unless otherwise stated above. Pertinent History Reviewed:  Reviewed past medical,surgical, social, obstetrical and family history.  Reviewed problem list, medications and allergies.  Physical Assessment:   Vitals:   04/27/22 1509  BP: 113/75  Pulse: 99  Weight: 148 lb 3.2 oz (67.2 kg)  Body mass index is 25.44 kg/m.        Physical Examination:   General appearance: Well appearing, and in no distress  Mental status: Alert, oriented to person, place, and time  Skin: Warm & dry  Respiratory: Normal respiratory effort, no distress  Abdomen: Soft, gravid, nontender  Pelvic: Cervical exam deferred         Extremities: Edema: None  Psych:  mood and affect appropriate  Fetal Status:     Movement: Present  breech,FHR 152 bpm,posterior placenta gr 0,normal left ovary,right ovary not  visualized,CX 3.6 cm,left renal pelvic dilatation 5.9 mm,right renal pelvis WNL 3.2 mm,EFW 433 g 85%,anatomy complete   Chaperone: n/a    No results found for this or any previous visit (from the past 24 hour(s)).   Assessment & Plan:  1) Low-risk pregnancy G3P1011 at [redacted]w[redacted]d with an Estimated Date of Delivery: 09/09/22   2) Left RPD -anatomy scan completed today, reviewed findings with mild pelvic dilation []  plan for repeat @ 32wk  -continue routine OB care  Meds: No orders of the defined types were placed in this encounter.  Labs/procedures today: anatomy scan  Plan:  Continue routine obstetrical care  Next visit: prefers in person    Reviewed: Preterm labor symptoms and general obstetric precautions including but not limited to vaginal bleeding, contractions, leaking of fluid and fetal movement were reviewed in detail with the patient.  All questions were answered. Pt has home bp cuff. Check bp weekly, let us know if >140/90.   Follow-up: Return in about 4 weeks (around 05/25/2022) for LROB visit.  No orders of the defined types were placed in this encounter.   05/27/2022, DO Attending Obstetrician & Gynecologist, [facility]

## 2022-04-27 NOTE — Progress Notes (Signed)
[place] 20+5 wks,breech,FHR 152 bpm,posterior placenta gr 0,normal left ovary,right ovary not visualized,CX 3.6 cm,left renal pelvic dilatation 5.9 mm,right renal pelvis WNL 3.2 mm,EFW 433 g 85%,anatomy complete

## 2022-05-25 ENCOUNTER — Ambulatory Visit (INDEPENDENT_AMBULATORY_CARE_PROVIDER_SITE_OTHER): Payer: Medicaid Other | Admitting: Advanced Practice Midwife

## 2022-05-25 VITALS — BP 127/76 | HR 87 | Wt 152.0 lb

## 2022-05-25 DIAGNOSIS — Z3480 Encounter for supervision of other normal pregnancy, unspecified trimester: Secondary | ICD-10-CM

## 2022-05-25 DIAGNOSIS — Z3A24 24 weeks gestation of pregnancy: Secondary | ICD-10-CM

## 2022-05-25 DIAGNOSIS — Z3482 Encounter for supervision of other normal pregnancy, second trimester: Secondary | ICD-10-CM

## 2022-05-25 MED ORDER — ESOMEPRAZOLE MAGNESIUM 20 MG PO TBEC: 1.0000 | DELAYED_RELEASE_TABLET | Freq: Every day | ORAL | 1 refills | Status: DC

## 2022-05-25 NOTE — Progress Notes (Signed)
   LOW-RISK PREGNANCY VISIT Patient name: April Murray MRN [id number]  Date of birth: 02-09-2001 Chief Complaint:   Routine Prenatal Visit  History of Present Illness:   April Murray is a 21 y.o. G31P1011 female at [redacted]w[redacted]d with an Estimated Date of Delivery: 09/09/22 being seen today for ongoing management of a low-risk pregnancy.  Today she reports heartburn. Contractions: Not present. Vag. Bleeding: None.  Movement: Present. denies leaking of fluid. Review of Systems:   Pertinent items are noted in HPI Denies abnormal vaginal discharge w/ itching/odor/irritation, headaches, visual changes, shortness of breath, chest pain, abdominal pain, severe nausea/vomiting, or problems with urination or bowel movements unless otherwise stated above. Pertinent History Reviewed:  Reviewed past medical,surgical, social, obstetrical and family history.  Reviewed problem list, medications and allergies. Physical Assessment:   Vitals:   05/25/22 1037  BP: 127/76  Pulse: 87  Weight: 152 lb (68.9 kg)  Body mass index is 26.09 kg/m.        Physical Examination:   General appearance: Well appearing, and in no distress  Mental status: Alert, oriented to person, place, and time  Skin: Warm & dry  Cardiovascular: Normal heart rate noted  Respiratory: Normal respiratory effort, no distress  Abdomen: Soft, gravid, nontender  Pelvic: Cervical exam deferred         Extremities: Edema: None  Fetal Status: Fetal Heart Rate (bpm): 132 Fundal Height: 24 cm Movement: Present    Chaperone: n/a    No results found for this or any previous visit (from the past 24 hour(s)).  Assessment & Plan:  1) Low-risk pregnancy G3P1011 at [redacted]w[redacted]d with an Estimated Date of Delivery: 09/09/22   2) heartburn,  rx nexium (prilosec need PA, this was preferred)   Meds:  Meds ordered this encounter  Medications   Esomeprazole Magnesium 20 MG TBEC    Sig: Take 1 tablet by mouth daily.    Dispense:  90 tablet    Refill:  1     Order Specific Question:   Supervising Provider    Answer:   Florian Buff [[id number]]   Labs/procedures today: none  Plan:  Continue routine obstetrical care  Next visit: prefers will be in person for PN2     Reviewed: Preterm labor symptoms and general obstetric precautions including but not limited to vaginal bleeding, contractions, leaking of fluid and fetal movement were reviewed in detail with the patient.  All questions were answered. Has home bp cuff.. Check bp weekly, let us know if >140/90.   Follow-up: Return in about 3 weeks (around 06/15/2022) for PN2/LROB.  Future Appointments  Date Time Provider [place]  06/15/2022  8:50 AM [facility]    No orders of the defined types were placed in this encounter.  Christin Fudge DNP, CNM 05/25/2022 11:22 AM

## 2022-05-25 NOTE — Patient Instructions (Signed)

## 2022-06-15 ENCOUNTER — Ambulatory Visit (INDEPENDENT_AMBULATORY_CARE_PROVIDER_SITE_OTHER): Payer: Medicaid Other | Admitting: Advanced Practice Midwife

## 2022-06-15 ENCOUNTER — Other Ambulatory Visit: Payer: Medicaid Other

## 2022-06-15 VITALS — BP 117/74 | HR 96 | Wt 153.0 lb

## 2022-06-15 DIAGNOSIS — Z23 Encounter for immunization: Secondary | ICD-10-CM | POA: Diagnosis not present

## 2022-06-15 DIAGNOSIS — Z3480 Encounter for supervision of other normal pregnancy, unspecified trimester: Secondary | ICD-10-CM

## 2022-06-15 DIAGNOSIS — Z3A27 27 weeks gestation of pregnancy: Secondary | ICD-10-CM

## 2022-06-15 DIAGNOSIS — Z3482 Encounter for supervision of other normal pregnancy, second trimester: Secondary | ICD-10-CM

## 2022-06-15 DIAGNOSIS — Z131 Encounter for screening for diabetes mellitus: Secondary | ICD-10-CM

## 2022-06-15 DIAGNOSIS — O35EXX1 Maternal care for other (suspected) fetal abnormality and damage, fetal genitourinary anomalies, fetus 1: Secondary | ICD-10-CM

## 2022-06-15 NOTE — Progress Notes (Signed)
   LOW-RISK PREGNANCY VISIT Patient name: April Murray MRN [id number]  Date of birth: 09-27-2000 Chief Complaint:   Routine Prenatal Visit  History of Present Illness:   April Murray is a 21 y.o. G47P1011 female at [redacted]w[redacted]d with an Estimated Date of Delivery: 09/09/22 being seen today for ongoing management of a low-risk pregnancy.  Today she reports round ligament pain. Contractions: Not present. Vag. Bleeding: None.  Movement: Present. denies leaking of fluid. Review of Systems:   Pertinent items are noted in HPI Denies abnormal vaginal discharge w/ itching/odor/irritation, headaches, visual changes, shortness of breath, chest pain, abdominal pain, severe nausea/vomiting, or problems with urination or bowel movements unless otherwise stated above. Pertinent History Reviewed:  Reviewed past medical,surgical, social, obstetrical and family history.  Reviewed problem list, medications and allergies. Physical Assessment:   Vitals:   06/15/22 1042  BP: 117/74  Pulse: 96  Weight: 153 lb (69.4 kg)  Body mass index is 26.26 kg/m.        Physical Examination:   General appearance: Well appearing, and in no distress  Mental status: Alert, oriented to person, place, and time  Skin: Warm & dry  Cardiovascular: Normal heart rate noted  Respiratory: Normal respiratory effort, no distress  Abdomen: Soft, gravid, nontender  Pelvic: Cervical exam deferred         Extremities: Edema: None  Fetal Status: Fetal Heart Rate (bpm): 135 Fundal Height: 27 cm Movement: Present    Chaperone: n/a    No results found for this or any previous visit (from the past 24 hour(s)).  Assessment & Plan:  1) Low-risk pregnancy G3P1011 at [redacted]w[redacted]d with an Estimated Date of Delivery: 09/09/22      Meds: No orders of the defined types were placed in this encounter.  Labs/procedures today: PN2,tdap, thinking about flu vaccine  Plan:  Continue routine obstetrical care  Next visit: prefers in person    Reviewed:  Preterm labor symptoms and general obstetric precautions including but not limited to vaginal bleeding, contractions, leaking of fluid and fetal movement were reviewed in detail with the patient.  All questions were answered. Has home bp cuff.. Check bp weekly, let us know if >140/90.   Follow-up: Return in about 3 weeks (around 07/06/2022) for [place], [place] to recheck kidneys.  No future appointments.  Orders Placed This Encounter  Procedures   US OB Follow Up   Tdap vaccine greater than or equal to 7yo IM   Christin Fudge DNP, CNM 06/15/2022 11:31 AM

## 2022-06-15 NOTE — Patient Instructions (Addendum)
Elmon Kirschner, I greatly value your feedback.  If you receive a survey following your visit with [place] today, we appreciate you taking the time to fill it out.  Thanks, Nigel Berthold, CNM   [facility] It is now Hall at [facility] ([place], [place] [address]) Entrance located off of [address] parking   Go to [email or web address] to register for FREE online childbirth classes    Call the office [phone]) or go to [facility] if: You begin to have strong, frequent contractions Your water breaks.  Sometimes it is a big gush of fluid, sometimes it is just a trickle that keeps getting your panties wet or running down your legs You have vaginal bleeding.  It is normal to have a small amount of spotting if your cervix was checked.  You don't feel your baby moving like normal.  If you don't, get you something to eat and drink and lay down and focus on feeling your baby move.  You should feel at least 10 movements in 2 hours.  If you don't, you should call the office or go to [facility].    Tdap Vaccine It is recommended that you get the Tdap vaccine during the third trimester of EACH pregnancy to help protect your baby from getting pertussis (whooping cough) 27-36 weeks is the BEST time to do this so that you can pass the protection on to your baby. During pregnancy is better than after pregnancy, but if you are unable to get it during pregnancy it will be offered at the hospital.  You will be offered this vaccine in the office after 27 weeks. If you do not have health insurance, you can get this vaccine at the health department or your family doctor Everyone who will be around your baby should also be up-to-date on their vaccines. Adults (who are not pregnant) only need 1 dose of Tdap during adulthood.   Third Trimester of Pregnancy The third trimester is from week 29 through week 42, months 7 through 9. The third  trimester is a time when the fetus is growing rapidly. At the end of the ninth month, the fetus is about 20 inches in length and weighs 6-10 pounds.  BODY CHANGES Your body goes through many changes during pregnancy. The changes vary from woman to woman.  Your weight will continue to increase. You can expect to gain 25-35 pounds (11-16 kg) by the end of the pregnancy. You may begin to get stretch marks on your hips, abdomen, and breasts. You may urinate more often because the fetus is moving lower into your pelvis and pressing on your bladder. You may develop or continue to have heartburn as a result of your pregnancy. You may develop constipation because certain hormones are causing the muscles that push waste through your intestines to slow down. You may develop hemorrhoids or swollen, bulging veins (varicose veins). You may have pelvic pain because of the weight gain and pregnancy hormones relaxing your joints between the bones in your pelvis. Backaches may result from overexertion of the muscles supporting your posture. You may have changes in your hair. These can include thickening of your hair, rapid growth, and changes in texture. Some women also have hair loss during or after pregnancy, or hair that feels dry or thin. Your hair will most likely return to normal after your baby is born. Your breasts will continue to grow and be tender. A  yellow discharge may leak from your breasts called colostrum. Your belly button may stick out. You may feel short of breath because of your expanding uterus. You may notice the fetus "dropping," or moving lower in your abdomen. You may have a bloody mucus discharge. This usually occurs a few days to a week before labor begins. Your cervix becomes thin and soft (effaced) near your due date. WHAT TO EXPECT AT YOUR PRENATAL EXAMS  You will have prenatal exams every 2 weeks until week 36. Then, you will have weekly prenatal exams. During a routine prenatal  visit: You will be weighed to make sure you and the fetus are growing normally. Your blood pressure is taken. Your abdomen will be measured to track your baby's growth. The fetal heartbeat will be listened to. Any test results from the previous visit will be discussed. You may have a cervical check near your due date to see if you have effaced. At around 36 weeks, your caregiver will check your cervix. At the same time, your caregiver will also perform a test on the secretions of the vaginal tissue. This test is to determine if a type of bacteria, Group B streptococcus, is present. Your caregiver will explain this further. Your caregiver may ask you: What your birth plan is. How you are feeling. If you are feeling the baby move. If you have had any abnormal symptoms, such as leaking fluid, bleeding, severe headaches, or abdominal cramping. If you have any questions. Other tests or screenings that may be performed during your third trimester include: Blood tests that check for low iron levels (anemia). Fetal testing to check the health, activity level, and growth of the fetus. Testing is done if you have certain medical conditions or if there are problems during the pregnancy. FALSE LABOR You may feel small, irregular contractions that eventually go away. These are called Braxton Hicks contractions, or false labor. Contractions may last for hours, days, or even weeks before true labor sets in. If contractions come at regular intervals, intensify, or become painful, it is best to be seen by your caregiver.  SIGNS OF LABOR  Menstrual-like cramps. Contractions that are 5 minutes apart or less. Contractions that start on the top of the uterus and spread down to the lower abdomen and back. A sense of increased pelvic pressure or back pain. A watery or bloody mucus discharge that comes from the vagina. If you have any of these signs before the 37th week of pregnancy, call your caregiver right away.  You need to go to the hospital to get checked immediately. HOME CARE INSTRUCTIONS  Avoid all smoking, herbs, alcohol, and unprescribed drugs. These chemicals affect the formation and growth of the baby. Follow your caregiver's instructions regarding medicine use. There are medicines that are either safe or unsafe to take during pregnancy. Exercise only as directed by your caregiver. Experiencing uterine cramps is a good sign to stop exercising. Continue to eat regular, healthy meals. Wear a good support bra for breast tenderness. Do not use hot tubs, steam rooms, or saunas. Wear your seat belt at all times when driving. Avoid raw meat, uncooked cheese, cat litter boxes, and soil used by cats. These carry germs that can cause birth defects in the baby. Take your prenatal vitamins. Try taking a stool softener (if your caregiver approves) if you develop constipation. Eat more high-fiber foods, such as fresh vegetables or fruit and whole grains. Drink plenty of fluids to keep your urine clear or pale yellow.  Take warm sitz baths to soothe any pain or discomfort caused by hemorrhoids. Use hemorrhoid cream if your caregiver approves. If you develop varicose veins, wear support hose. Elevate your feet for 15 minutes, 3-4 times a day. Limit salt in your diet. Avoid heavy lifting, wear low heal shoes, and practice good posture. Rest a lot with your legs elevated if you have leg cramps or low back pain. Visit your dentist if you have not gone during your pregnancy. Use a soft toothbrush to brush your teeth and be gentle when you floss. A sexual relationship may be continued unless your caregiver directs you otherwise. Do not travel far distances unless it is absolutely necessary and only with the approval of your caregiver. Take prenatal classes to understand, practice, and ask questions about the labor and delivery. Make a trial run to the hospital. Pack your hospital bag. Prepare the baby's  nursery. Continue to go to all your prenatal visits as directed by your caregiver. SEEK MEDICAL CARE IF: You are unsure if you are in labor or if your water has broken. You have dizziness. You have mild pelvic cramps, pelvic pressure, or nagging pain in your abdominal area. You have persistent nausea, vomiting, or diarrhea. You have a bad smelling vaginal discharge. You have pain with urination. SEEK IMMEDIATE MEDICAL CARE IF:  You have a fever. You are leaking fluid from your vagina. You have spotting or bleeding from your vagina. You have severe abdominal cramping or pain. You have rapid weight loss or gain. You have shortness of breath with chest pain. You notice sudden or extreme swelling of your face, hands, ankles, feet, or legs. You have not felt your baby move in over an hour. You have severe headaches that do not go away with medicine. You have vision changes. Document Released: 08/15/2001 Document Revised: 08/26/2013 Document Reviewed: 10/22/2012 [facility] Patient Information 2015 [place], [place]. This information is not intended to replace advice given to you by your health care provider. Make sure you discuss any questions you have with your health care provider.   Kinesiology taping for pregnancy:  Youtube has good vidoes of "how tos" for lower back, pelvic, hip pain; swelling of feet, etc

## 2022-06-16 LAB — CBC
Hematocrit: 32.3 % — ABNORMAL LOW (ref 34.0–46.6)
Hemoglobin: 10.8 g/dL — ABNORMAL LOW (ref 11.1–15.9)
MCH: 29.6 pg (ref 26.6–33.0)
MCHC: 33.4 g/dL (ref 31.5–35.7)
MCV: 89 fL (ref 79–97)
Platelets: 203 10*3/uL (ref 150–450)
RBC: 3.65 x10E6/uL — ABNORMAL LOW (ref 3.77–5.28)
RDW: 11.8 % (ref 11.7–15.4)
WBC: 6.2 10*3/uL (ref 3.4–10.8)

## 2022-06-16 LAB — ANTIBODY SCREEN: Antibody Screen: NEGATIVE

## 2022-06-16 LAB — GLUCOSE TOLERANCE, 2 HOURS W/ 1HR
Glucose, 1 hour: 140 mg/dL (ref 70–179)
Glucose, 2 hour: 101 mg/dL (ref 70–152)
Glucose, Fasting: 84 mg/dL (ref 70–91)

## 2022-06-16 LAB — HIV ANTIBODY (ROUTINE TESTING W REFLEX): HIV Screen 4th Generation wRfx: NONREACTIVE

## 2022-06-16 LAB — RPR: RPR Ser Ql: NONREACTIVE

## 2022-07-06 ENCOUNTER — Ambulatory Visit (INDEPENDENT_AMBULATORY_CARE_PROVIDER_SITE_OTHER): Payer: Medicaid Other | Admitting: Obstetrics & Gynecology

## 2022-07-06 ENCOUNTER — Ambulatory Visit (INDEPENDENT_AMBULATORY_CARE_PROVIDER_SITE_OTHER): Payer: Medicaid Other

## 2022-07-06 VITALS — BP 115/73 | HR 84 | Wt 156.0 lb

## 2022-07-06 DIAGNOSIS — Z3A30 30 weeks gestation of pregnancy: Secondary | ICD-10-CM

## 2022-07-06 DIAGNOSIS — O35EXX1 Maternal care for other (suspected) fetal abnormality and damage, fetal genitourinary anomalies, fetus 1: Secondary | ICD-10-CM | POA: Diagnosis not present

## 2022-07-06 DIAGNOSIS — Z3482 Encounter for supervision of other normal pregnancy, second trimester: Secondary | ICD-10-CM

## 2022-07-06 DIAGNOSIS — Z3480 Encounter for supervision of other normal pregnancy, unspecified trimester: Secondary | ICD-10-CM

## 2022-07-06 DIAGNOSIS — Z3A31 31 weeks gestation of pregnancy: Secondary | ICD-10-CM

## 2022-07-06 NOTE — Progress Notes (Signed)
[place] 19+6 wks,cephalic,posterior placenta gr 1,AFI 9.5 cm,resolved left renal pelvic dilatation,FHR 150 bpm,EFW 1956 g 88%

## 2022-07-06 NOTE — Progress Notes (Signed)
   LOW-RISK PREGNANCY VISIT Patient name: April Murray MRN [id number]  Date of birth: 06-03-2001 Chief Complaint:   Routine Prenatal Visit  History of Present Illness:   April Murray is a 21 y.o. G38P1011 female at [redacted]w[redacted]d with an Estimated Date of Delivery: 09/09/22 being seen today for ongoing management of a low-risk pregnancy.     06/15/2022   10:55 AM 02/28/2022    2:07 PM 11/07/2016   11:06 AM  Depression screen PHQ 2/9  Decreased Interest 1 0 3  Down, Depressed, Hopeless 0 0 3  PHQ - 2 Score 1 0 6  Altered sleeping 2 1 0  Tired, decreased energy 1 1 3   Change in appetite 2 1 3   Feeling bad or failure about yourself  0 0 3  Trouble concentrating 0 0 0  Moving slowly or fidgety/restless 0 0 0  Suicidal thoughts 0 0 0  PHQ-9 Score 6 3 15     Today she reports backache. Contractions: Not present. Vag. Bleeding: None.  Movement: Present. denies leaking of fluid. Review of Systems:   Pertinent items are noted in HPI Denies abnormal vaginal discharge w/ itching/odor/irritation, headaches, visual changes, shortness of breath, chest pain, abdominal pain, severe nausea/vomiting, or problems with urination or bowel movements unless otherwise stated above. Pertinent History Reviewed:  Reviewed past medical,surgical, social, obstetrical and family history.  Reviewed problem list, medications and allergies. Physical Assessment:   Vitals:   07/06/22 0909  BP: 115/73  Pulse: 84  Weight: 156 lb (70.8 kg)  Body mass index is 26.78 kg/m.        Physical Examination:   General appearance: Well appearing, and in no distress  Mental status: Alert, oriented to person, place, and time  Skin: Warm & dry  Cardiovascular: Normal heart rate noted  Respiratory: Normal respiratory effort, no distress  Abdomen: Soft, gravid, nontender  Pelvic: Cervical exam deferred         Extremities:    Fetal Status:     Movement: Present    Chaperone: n/a    No results found for this or any previous  visit (from the past 24 hour(s)).  Assessment & Plan:  1) Low-risk pregnancy G3P1011 at [redacted]w[redacted]d with an Estimated Date of Delivery: 09/09/22   2) LGA, borderline, 88%, resolved RPD, recheck sonogram 36 weeks   Meds: No orders of the defined types were placed in this encounter.  Labs/procedures today: sonogram  Plan:  Continue routine obstetrical care  Next visit: prefers in person      Follow-up: Return in about 3 weeks (around 07/27/2022) for [place].  No orders of the defined types were placed in this encounter.   Florian Buff, MD 07/06/2022 9:30 AM

## 2022-07-31 ENCOUNTER — Encounter: Payer: Self-pay | Admitting: Women's Health

## 2022-07-31 ENCOUNTER — Ambulatory Visit (INDEPENDENT_AMBULATORY_CARE_PROVIDER_SITE_OTHER): Payer: Medicaid Other | Admitting: Women's Health

## 2022-07-31 VITALS — BP 118/76 | HR 103 | Wt 148.4 lb

## 2022-07-31 DIAGNOSIS — Z3A34 34 weeks gestation of pregnancy: Secondary | ICD-10-CM

## 2022-07-31 DIAGNOSIS — Z3480 Encounter for supervision of other normal pregnancy, unspecified trimester: Secondary | ICD-10-CM

## 2022-07-31 DIAGNOSIS — Z3483 Encounter for supervision of other normal pregnancy, third trimester: Secondary | ICD-10-CM

## 2022-07-31 DIAGNOSIS — O26843 Uterine size-date discrepancy, third trimester: Secondary | ICD-10-CM

## 2022-07-31 NOTE — Patient Instructions (Signed)
April Murray, thank you for choosing our office today! We appreciate the opportunity to meet your healthcare needs. You may receive a short survey by mail, e-mail, or through [employer]. If you are happy with your care we would appreciate if you could take just a few minutes to complete the survey questions. We read all of your comments and take your feedback very seriously. Thank you again for choosing our office.  Center for [employer] Team at [facility]  [facility] at [facility] ([address] [place], [place] [address]) Entrance C, located off of [address] Free 24/7 valet parking   CLASSES: Go to [email or web address] to register for classes (childbirth, breastfeeding, waterbirth, infant CPR, daddy bootcamp, etc.)  Call the office [phone]) or go to [facility] if: You begin to have strong, frequent contractions Your water breaks.  Sometimes it is a big gush of fluid, sometimes it is just a trickle that keeps getting your panties wet or running down your legs You have vaginal bleeding.  It is normal to have a small amount of spotting if your cervix was checked.  You don't feel your baby moving like normal.  If you don't, get you something to eat and drink and lay down and focus on feeling your baby move.   If your baby is still not moving like normal, you should call the office or go to [facility].  Call the office [phone]) or go to [facility] hospital for these signs of pre-eclampsia: Severe headache that does not go away with Tylenol Visual changes- seeing spots, double, blurred vision Pain under your right breast or upper abdomen that does not go away with Tums or heartburn medicine Nausea and/or vomiting Severe swelling in your hands, feet, and face   Tdap Vaccine It is recommended that you get the Tdap vaccine during the third trimester of EACH pregnancy to help protect your baby from getting pertussis (whooping cough) 27-36 weeks is the BEST time to do  this so that you can pass the protection on to your baby. During pregnancy is better than after pregnancy, but if you are unable to get it during pregnancy it will be offered at the hospital.  You can get this vaccine with [place], at the health department, your family doctor, or some local pharmacies Everyone who will be around your baby should also be up-to-date on their vaccines before the baby comes. Adults (who are not pregnant) only need 1 dose of Tdap during adulthood.   [facility] Pediatricians/Family Doctors [facility]): [address] [place], [phone]           [facility]: [address], [phone]                [facility]): [address], [phone] (call to ask if accepting patients) [facility] Department: [address], [place], [phone]    [facility] Pediatricians/Family Doctors [facility]): [phone], [phone] [facility]: [address] [place], [phone] [facility]: [address], [phone]  [facility] Doctors  [facility]): [phone] [facility]: [address] [phone]   [facility] Doctors [facility]: [address], [phone]  [facility] Family Doctors  [facility]: [phone], [phone]  Home Blood Pressure Monitoring for Patients   Your provider has recommended that you check your  blood pressure (BP) at least once a week at home. If you do not have a blood pressure cuff at home, one will be provided for you. Contact your provider if you have not received your monitor within 1 week.   Helpful Tips for Accurate Home Blood Pressure Checks  Don't smoke, exercise, or drink caffeine 30 minutes before checking your BP Use the restroom before checking your BP (a full bladder can raise your  pressure) Relax in a comfortable upright chair Feet on the ground Left arm resting comfortably on a flat surface at the level of your heart Legs uncrossed Back supported Sit quietly and don't talk Place the cuff on your bare arm Adjust snuggly, so that only two fingertips can fit between your skin and the top of the cuff Check 2 readings separated by at least one minute Keep a log of your BP readings For a visual, please reference this diagram: http://ccnc.care/bpdiagram  Provider Name: [facility]     Phone: [phone]  Zone 1: ALL CLEAR  Continue to monitor your symptoms:  BP reading is less than 140 (top number) or less than 90 (bottom number)  No right upper stomach pain No headaches or seeing spots No feeling nauseated or throwing up No swelling in face and hands  Zone 2: CAUTION Call your doctor's office for any of the following:  BP reading is greater than 140 (top number) or greater than 90 (bottom number)  Stomach pain under your ribs in the middle or right side Headaches or seeing spots Feeling nauseated or throwing up Swelling in face and hands  Zone 3: EMERGENCY  Seek immediate medical care if you have any of the following:  BP reading is greater than160 (top number) or greater than 110 (bottom number) Severe headaches not improving with Tylenol Serious difficulty catching your breath Any worsening symptoms from Zone 2  Preterm Labor and Birth Information  The normal length of a pregnancy is 39-41 weeks. Preterm labor is when labor starts before 37 completed weeks of pregnancy. What are the risk factors for preterm labor? Preterm labor is more likely to occur in women who: Have certain infections during pregnancy such as a bladder infection, sexually transmitted infection, or infection inside the uterus (chorioamnionitis). Have a shorter-than-normal cervix. Have gone into preterm labor before. Have had surgery on their cervix. Are younger than age 17  or older than age 35. Are African American. Are pregnant with twins or multiple babies (multiple gestation). Take street drugs or smoke while pregnant. Do not gain enough weight while pregnant. Became pregnant shortly after having been pregnant. What are the symptoms of preterm labor? Symptoms of preterm labor include: Cramps similar to those that can happen during a menstrual period. The cramps may happen with diarrhea. Pain in the abdomen or lower back. Regular uterine contractions that may feel like tightening of the abdomen. A feeling of increased pressure in the pelvis. Increased watery or bloody mucus discharge from the vagina. Water breaking (ruptured amniotic sac). Why is it important to recognize signs of preterm labor? It is important to recognize signs of preterm labor because babies who are born prematurely may not be fully developed. This can put them at an increased risk for: Long-term (chronic) heart and lung problems. Difficulty immediately after birth with regulating body systems, including blood sugar, body temperature, heart rate, and breathing rate. Bleeding in the brain. Cerebral palsy. Learning difficulties. Death. These risks are highest for babies who are born before 34 weeks   of pregnancy. How is preterm labor treated? Treatment depends on the length of your pregnancy, your condition, and the health of your baby. It may involve: Having a stitch (suture) placed in your cervix to prevent your cervix from opening too early (cerclage). Taking or being given medicines, such as: Hormone medicines. These may be given early in pregnancy to help support the pregnancy. Medicine to stop contractions. Medicines to help mature the baby's lungs. These may be prescribed if the risk of delivery is high. Medicines to prevent your baby from developing cerebral palsy. If the labor happens before 34 weeks of pregnancy, you may need to stay in the hospital. What should I do if I  think I am in preterm labor? If you think that you are going into preterm labor, call your health care provider right away. How can I prevent preterm labor in future pregnancies? To increase your chance of having a full-term pregnancy: Do not use any tobacco products, such as cigarettes, chewing tobacco, and e-cigarettes. If you need help quitting, ask your health care provider. Do not use street drugs or medicines that have not been prescribed to you during your pregnancy. Talk with your health care provider before taking any herbal supplements, even if you have been taking them regularly. Make sure you gain a healthy amount of weight during your pregnancy. Watch for infection. If you think that you might have an infection, get it checked right away. Make sure to tell your health care provider if you have gone into preterm labor before. This information is not intended to replace advice given to you by your health care provider. Make sure you discuss any questions you have with your health care provider. Document Revised: 12/13/2018 Document Reviewed: 01/12/2016 Elsevier Patient Education  2020 Elsevier Inc.   

## 2022-07-31 NOTE — Progress Notes (Signed)
LOW-RISK PREGNANCY VISIT Patient name: April Murray MRN [id number]  Date of birth: [birth year] Chief Complaint:   Routine Prenatal Visit  History of Present Illness:   April Murray is a 21 y.o. G39P1011 female at [redacted]w[redacted]d with an Estimated Date of Delivery: 09/09/22 being seen today for ongoing management of a low-risk pregnancy.   Today she reports  lightning crotch . Contractions: Not present. Vag. Bleeding: None.  Movement: Present. denies leaking of fluid.     06/15/2022   10:55 AM 02/28/2022    2:07 PM 11/07/2016   11:06 AM  Depression screen PHQ 2/9  Decreased Interest 1 0 3  Down, Depressed, Hopeless 0 0 3  PHQ - 2 Score 1 0 6  Altered sleeping 2 1 0  Tired, decreased energy 1 1 3   Change in appetite 2 1 3   Feeling bad or failure about yourself  0 0 3  Trouble concentrating 0 0 0  Moving slowly or fidgety/restless 0 0 0  Suicidal thoughts 0 0 0  PHQ-9 Score 6 3 15         06/15/2022   10:56 AM 02/28/2022    2:08 PM  GAD 7 : Generalized Anxiety Score  Nervous, Anxious, on Edge 1 1  Control/stop worrying 0 1  Worry too much - different things 1 1  Trouble relaxing 1 1  Restless 0 0  Easily annoyed or irritable 1 1  Afraid - awful might happen 0 0  Total GAD 7 Score 4 5      Review of Systems:   Pertinent items are noted in HPI Denies abnormal vaginal discharge w/ itching/odor/irritation, headaches, visual changes, shortness of breath, chest pain, abdominal pain, severe nausea/vomiting, or problems with urination or bowel movements unless otherwise stated above. Pertinent History Reviewed:  Reviewed past medical,surgical, social, obstetrical and family history.  Reviewed problem list, medications and allergies. Physical Assessment:   Vitals:   07/31/22 1033  BP: 118/76  Pulse: (!) 103  Weight: 148 lb 6 oz (67.3 kg)  Body mass index is 25.47 kg/m.        Physical Examination:   General appearance: Well appearing, and in no distress  Mental status:  Alert, oriented to person, place, and time  Skin: Warm & dry  Cardiovascular: Normal heart rate noted  Respiratory: Normal respiratory effort, no distress  Abdomen: Soft, gravid, nontender  Pelvic: Cervical exam deferred         Extremities: Edema: None  Fetal Status: Fetal Heart Rate (bpm): 140 Fundal Height: 31 cm Movement: Present    Chaperone: N/A   No results found for this or any previous visit (from the past 24 hour(s)).  Assessment & Plan:  1) Low-risk pregnancy G3P1011 at [redacted]w[redacted]d with an Estimated Date of Delivery: 09/09/22   2) EFW 88% @ 32wks, per note, rpt @ 36wks, measuring a little small today. Will get EFW next visit.    Meds: No orders of the defined types were placed in this encounter.  Labs/procedures today: none  Plan:  Continue routine obstetrical care  Next visit: prefers will be in person for u/s     Reviewed: Preterm labor symptoms and general obstetric precautions including but not limited to vaginal bleeding, contractions, leaking of fluid and fetal movement were reviewed in detail with the patient.  All questions were answered. Does have home bp cuff. Office bp cuff given: not applicable. Check bp weekly, let 08/02/22 know if consistently >140 and/or >90.  Follow-up: Return in  about 2 weeks (around 08/14/2022) for LROB, US:EFW, CNM, in person.  No future appointments.  Orders Placed This Encounter  Procedures   US OB Follow Up   Cheral Marker CNM, [facility] 07/31/2022 11:09 AM

## 2022-08-16 ENCOUNTER — Encounter: Payer: Self-pay | Admitting: Obstetrics & Gynecology

## 2022-08-16 ENCOUNTER — Ambulatory Visit (INDEPENDENT_AMBULATORY_CARE_PROVIDER_SITE_OTHER): Payer: Medicaid Other | Admitting: Obstetrics & Gynecology

## 2022-08-16 ENCOUNTER — Ambulatory Visit (INDEPENDENT_AMBULATORY_CARE_PROVIDER_SITE_OTHER): Payer: Medicaid Other

## 2022-08-16 ENCOUNTER — Other Ambulatory Visit (HOSPITAL_COMMUNITY)
Admission: RE | Admit: 2022-08-16 | Discharge: 2022-08-16 | Disposition: A | Discharge status: Home / Self Care | Payer: Medicaid Other | Source: Ambulatory Visit | Attending: Obstetrics & Gynecology | Admitting: Obstetrics & Gynecology

## 2022-08-16 VITALS — BP 127/80 | HR 88 | Wt 157.0 lb

## 2022-08-16 DIAGNOSIS — O26843 Uterine size-date discrepancy, third trimester: Secondary | ICD-10-CM

## 2022-08-16 DIAGNOSIS — Z3A36 36 weeks gestation of pregnancy: Secondary | ICD-10-CM | POA: Diagnosis not present

## 2022-08-16 DIAGNOSIS — Z3483 Encounter for supervision of other normal pregnancy, third trimester: Secondary | ICD-10-CM

## 2022-08-16 DIAGNOSIS — N898 Other specified noninflammatory disorders of vagina: Secondary | ICD-10-CM | POA: Insufficient documentation

## 2022-08-16 DIAGNOSIS — Z3480 Encounter for supervision of other normal pregnancy, unspecified trimester: Secondary | ICD-10-CM

## 2022-08-16 NOTE — Progress Notes (Signed)
[place] 36+4 wks,cephalic,posterior placenta gr 3,AFI 12.6 cm,FHR 161 bpm,EFW 3239 g 78%

## 2022-08-16 NOTE — Progress Notes (Signed)
   LOW-RISK PREGNANCY VISIT Patient name: April Murray MRN [id number]  Date of birth: Jan 16, 2001 Chief Complaint:   Routine Prenatal Visit  History of Present Illness:   April Murray is a 21 y.o. G71P1011 female at [redacted]w[redacted]d with an Estimated Date of Delivery: 09/09/22 being seen today for ongoing management of a low-risk pregnancy.      06/15/2022   10:55 AM 02/28/2022    2:07 PM 11/07/2016   11:06 AM  Depression screen PHQ 2/9  Decreased Interest 1 0 3  Down, Depressed, Hopeless 0 0 3  PHQ - 2 Score 1 0 6  Altered sleeping 2 1 0  Tired, decreased energy 1 1 3   Change in appetite 2 1 3   Feeling bad or failure about yourself  0 0 3  Trouble concentrating 0 0 0  Moving slowly or fidgety/restless 0 0 0  Suicidal thoughts 0 0 0  PHQ-9 Score 6 3 15     Today she reports  increased vaginal discharge- comes/goes.  Denies itching or irritation . Contractions: Irritability. Vag. Bleeding: None.  Movement: Present. denies leaking of fluid. Review of Systems:   Pertinent items are noted in HPI Denies headaches, visual changes, shortness of breath, chest pain, abdominal pain, severe nausea/vomiting, or problems with urination or bowel movements unless otherwise stated above. Pertinent History Reviewed:  Reviewed past medical,surgical, social, obstetrical and family history.  Reviewed problem list, medications and allergies.  Physical Assessment:   Vitals:   08/16/22 1209  BP: 127/80  Pulse: 88  Weight: 157 lb (71.2 kg)  Body mass index is 26.95 kg/m.        Physical Examination:   General appearance: Well appearing, and in no distress  Mental status: Alert, oriented to person, place, and time  Skin: Warm & dry  Respiratory: Normal respiratory effort, no distress  Abdomen: Soft, gravid, nontender  Pelvic: Cervical exam performed  Dilation: Closed Effacement (%): Thick Station: -3  Extremities: Edema: None  Psych:  mood and affect appropriate  Fetal Status:     Movement: Present   cephalic,posterior placenta gr 3,AFI 12.6 cm,FHR 161 bpm,EFW 3239 g 78%   Chaperone:  pt declined     No results found for this or any previous visit (from the past 24 hour(s)).   Assessment & Plan:  1) Low-risk pregnancy G3P1011 at [redacted]w[redacted]d with an Estimated Date of Delivery: 09/09/22   2) Vaginal discharge- vaginitis panel obtained  3) Bilateral renal pelvic dilation -reviewed 08/18/22 findings and discussed mild RPD- reassured pt that often this is an incidental benign findings- no further management indicated   Meds: No orders of the defined types were placed in this encounter.  Labs/procedures today: GC/C, GBS collected  Plan:  Continue routine obstetrical care  Next visit: prefers in person    Reviewed: Preterm labor symptoms and general obstetric precautions including but not limited to vaginal bleeding, contractions, leaking of fluid and fetal movement were reviewed in detail with the patient.  All questions were answered. Pt has home bp cuff. Check bp weekly, let [redacted]w[redacted]d know if >140/90.   Follow-up: Return in about 1 week (around 08/23/2022) for LROB visit.  Orders Placed This Encounter  Procedures   Strep Gp B NAA+Rflx    [place], DO Attending Obstetrician & Gynecologist, [facility] for 08/25/2022, [facility]

## 2022-08-17 LAB — CERVICOVAGINAL ANCILLARY ONLY
Bacterial Vaginitis (gardnerella): NEGATIVE
Candida Glabrata: NEGATIVE
Candida Vaginitis: NEGATIVE
Chlamydia: NEGATIVE
Comment: NEGATIVE
Comment: NEGATIVE
Comment: NEGATIVE
Comment: NEGATIVE
Comment: NEGATIVE
Comment: NORMAL
Neisseria Gonorrhea: NEGATIVE
Trichomonas: NEGATIVE

## 2022-08-18 LAB — STREP GP B NAA+RFLX: Strep Gp B NAA+Rflx: NEGATIVE

## 2022-08-23 ENCOUNTER — Ambulatory Visit (INDEPENDENT_AMBULATORY_CARE_PROVIDER_SITE_OTHER): Payer: Medicaid Other | Admitting: Obstetrics & Gynecology

## 2022-08-23 ENCOUNTER — Encounter: Payer: Self-pay | Admitting: Obstetrics & Gynecology

## 2022-08-23 VITALS — BP 132/73 | HR 92 | Wt 160.4 lb

## 2022-08-23 DIAGNOSIS — Z3483 Encounter for supervision of other normal pregnancy, third trimester: Secondary | ICD-10-CM

## 2022-08-23 NOTE — Progress Notes (Signed)
   LOW-RISK PREGNANCY VISIT Patient name: April Murray MRN [id number]  Date of birth: 2001-02-02 Chief Complaint:   Routine Prenatal Visit  History of Present Illness:   April Murray is a 21 y.o. G58P1011 female at [redacted]w[redacted]d with an Estimated Date of Delivery: 09/09/22 being seen today for ongoing management of a low-risk pregnancy.      06/15/2022   10:55 AM 02/28/2022    2:07 PM 11/07/2016   11:06 AM  Depression screen PHQ 2/9  Decreased Interest 1 0 3  Down, Depressed, Hopeless 0 0 3  PHQ - 2 Score 1 0 6  Altered sleeping 2 1 0  Tired, decreased energy 1 1 3   Change in appetite 2 1 3   Feeling bad or failure about yourself  0 0 3  Trouble concentrating 0 0 0  Moving slowly or fidgety/restless 0 0 0  Suicidal thoughts 0 0 0  PHQ-9 Score 6 3 15     Today she reports  pelvic pressure and irregular contractions . Vag. Bleeding: None.  Movement: Present. denies leaking of fluid. Review of Systems:   Pertinent items are noted in HPI Denies abnormal vaginal discharge w/ itching/odor/irritation, headaches, visual changes, shortness of breath, chest pain, abdominal pain, severe nausea/vomiting, or problems with urination or bowel movements unless otherwise stated above. Pertinent History Reviewed:  Reviewed past medical,surgical, social, obstetrical and family history.  Reviewed problem list, medications and allergies.  Physical Assessment:   Vitals:   08/23/22 1146  BP: 132/73  Pulse: 92  Weight: 160 lb 6.4 oz (72.8 kg)  Body mass index is 27.53 kg/m.        Physical Examination:   General appearance: Well appearing, and in no distress  Mental status: Alert, oriented to person, place, and time  Skin: Warm & dry  Respiratory: Normal respiratory effort, no distress  Abdomen: Soft, gravid, nontender  Pelvic: SVE: Dilation: 2 Effacement (%): 50 Station: -3  Extremities: Edema: None  Psych:  mood and affect appropriate  Fetal Status: Fetal Heart Rate (bpm): 140 Fundal Height: 36  cm Movement: Present    Chaperone:  pt declined     No results found for this or any previous visit (from the past 24 hour(s)).   Assessment & Plan:  1) Low-risk pregnancy G3P1011 at [redacted]w[redacted]d with an Estimated Date of Delivery: 09/09/22   -expectant management   Meds: No orders of the defined types were placed in this encounter.  Labs/procedures today: none  Plan:  Continue routine obstetrical care  Next visit: prefers in person    Reviewed: Term labor symptoms and general obstetric precautions including but not limited to vaginal bleeding, contractions, leaking of fluid and fetal movement were reviewed in detail with the patient.  All questions were answered. Pt has home bp cuff. Check bp weekly, let 08/25/22 know if >140/90.   Follow-up: Return in about 1 week (around 08/30/2022) for LROB visit.  No orders of the defined types were placed in this encounter.   11/08/22, DO Attending Obstetrician & Gynecologist, [facility] for 09/01/2022, [facility]

## 2022-08-26 ENCOUNTER — Encounter (HOSPITAL_COMMUNITY): Payer: Self-pay | Admitting: Obstetrics & Gynecology

## 2022-08-26 ENCOUNTER — Inpatient Hospital Stay (HOSPITAL_COMMUNITY)
Admission: AD | Admit: 2022-08-26 | Discharge: 2022-08-26 | Disposition: A | Discharge status: Home / Self Care | Payer: Medicaid Other | Attending: Obstetrics & Gynecology | Admitting: Obstetrics & Gynecology

## 2022-08-26 DIAGNOSIS — O23593 Infection of other part of genital tract in pregnancy, third trimester: Secondary | ICD-10-CM | POA: Insufficient documentation

## 2022-08-26 DIAGNOSIS — O26893 Other specified pregnancy related conditions, third trimester: Secondary | ICD-10-CM | POA: Diagnosis not present

## 2022-08-26 DIAGNOSIS — O98813 Other maternal infectious and parasitic diseases complicating pregnancy, third trimester: Secondary | ICD-10-CM | POA: Insufficient documentation

## 2022-08-26 DIAGNOSIS — O368130 Decreased fetal movements, third trimester, not applicable or unspecified: Secondary | ICD-10-CM | POA: Insufficient documentation

## 2022-08-26 DIAGNOSIS — B3731 Acute candidiasis of vulva and vagina: Secondary | ICD-10-CM

## 2022-08-26 DIAGNOSIS — Z3689 Encounter for other specified antenatal screening: Secondary | ICD-10-CM

## 2022-08-26 DIAGNOSIS — Z3A38 38 weeks gestation of pregnancy: Secondary | ICD-10-CM

## 2022-08-26 DIAGNOSIS — Z3493 Encounter for supervision of normal pregnancy, unspecified, third trimester: Secondary | ICD-10-CM

## 2022-08-26 DIAGNOSIS — Z3480 Encounter for supervision of other normal pregnancy, unspecified trimester: Secondary | ICD-10-CM

## 2022-08-26 LAB — WET PREP, GENITAL
Clue Cells Wet Prep HPF POC: NONE SEEN
Sperm: NONE SEEN
Trich, Wet Prep: NONE SEEN
WBC, Wet Prep HPF POC: >=10 — AB (ref <10)

## 2022-08-26 LAB — POCT FERN TEST: POCT Fern Test: NEGATIVE

## 2022-08-26 MED ORDER — TERCONAZOLE 0.4 % VA CREA: 1.0000 | TOPICAL_CREAM | Freq: Every day | VAGINAL | 0 refills | Status: DC

## 2022-08-26 NOTE — MAU Provider Note (Signed)
History     CSN: [id number]  Arrival date and time: 08/26/22 1201   None     Chief Complaint  Patient presents with   Rupture of Membranes   Abdominal Pain   Decreased Fetal Movement   HPI April Murray is a 21 y.o. G3P1011 at [redacted]w[redacted]d who presents to MAU for leaking fluid. Patient reports at 0900 she had a gush of clear fluid that went through underwear and onto her pants. Since then her underwear have "just felt wet". She is not having to wear a pad. She denies vaginal bleeding. Reports some mild abdominal cramping. She reports fetal movement although movements have been less/not as intense.  Patient receives [facility] at [facility] and next appointment is on 12/27.   OB History     Gravida  3   Para  1   Term  1   Preterm      AB  1   Living  1      SAB  1   IAB      Ectopic      Multiple      Live Births  1           Past Medical History:  Diagnosis Date   Depression    Miscarriage    Scoliosis    Seasonal allergies    UTI (lower urinary tract infection)     Past Surgical History:  Procedure Laterality Date   TONSILLECTOMY AND ADENOIDECTOMY      Family History  Problem Relation Age of Onset   Asthma Mother    Syncope episode Mother    Seizures Mother    Bipolar disorder Mother    Depression Mother    Anxiety disorder Mother    Migraines Father    Healthy Brother    ADD / ADHD Brother    Depression Maternal Aunt    Bipolar disorder Maternal Uncle    Schizophrenia Maternal Uncle    Other Maternal Uncle        DDD   Mental illness Maternal Uncle    Arthritis Maternal Grandmother    Depression Maternal Grandmother    Stroke Maternal Grandfather    Arthritis Maternal Grandfather    Other Maternal Grandfather        DDD   Hypertension Maternal Grandfather    Hyperlipidemia Paternal Grandmother    Alcoholism Paternal Grandfather    Hypertension Paternal Grandfather    Bipolar disorder Other    COPD Other    Endometriosis Other     Polycystic ovary syndrome Other    Other Other        DDD   Irritable bowel syndrome Other    Other Other        Inter cystitis   Migraines Other    Lung cancer Other        Paternal side Aunts & Uncles   Autism Neg Hx     Social History   Tobacco Use   Smoking status: Former    Types: Cigarettes    Passive exposure: Yes   Smokeless tobacco: Never   Tobacco comments:    Mother smokes in her room  Vaping Use   Vaping Use: Former  Substance Use Topics   Alcohol use: Not Currently    Comment: every 2 weeks   Drug use: Not Currently    Allergies:  Allergies  Allergen Reactions   Penicillins     Itching and swelling    No medications prior to  admission.   Review of Systems  Constitutional: Negative.   Respiratory: Negative.    Cardiovascular: Negative.   Gastrointestinal:  Positive for abdominal pain (cramping).  Genitourinary:  Positive for vaginal discharge.  Musculoskeletal: Negative.   Neurological: Negative.    Physical Exam   Blood pressure 113/72, pulse 80, temperature 98 F (36.7 C), temperature source Oral, resp. rate 16, height 5\' 3"  (1.6 m), weight 72.1 kg, SpO2 100 %.  Physical Exam Vitals and nursing note reviewed. Exam conducted with a chaperone present.  Constitutional:      General: She is not in acute distress. Cardiovascular:     Rate and Rhythm: Normal rate.  Pulmonary:     Effort: Pulmonary effort is normal.  Abdominal:     Palpations: Abdomen is soft.     Tenderness: There is no abdominal tenderness.     Comments: Gravid    Genitourinary:    Comments: Normal external female genitalia, vaginal walls pink with rugae, small amount of thick, clumpy white discharge noted along vaginal walls c/w yeast, no pooling of amniotic fluid, no bleeding, cervix visually 2cm without lesion/masses Skin:    General: Skin is warm and dry.  Neurological:     General: No focal deficit present.     Mental Status: She is alert and oriented to person,  place, and time.  Psychiatric:        Mood and Affect: Mood normal.        Behavior: Behavior normal.   Dilation: 3 Effacement (%): 50 Station: -3 Presentation: Vertex Exam by:: D. Leina Babe CNM  NST FHR: 120 bpm, moderate variability, +15x15 accels, no decels Toco: quiet  MAU Course  Procedures NST  MDM SSE negative for pooling of amniotic fluid. Vaginal discharge c/w yeast. Fern slide negative. Wet prep positive for yeast.  NST reactive and reassuring. Patient reports feeling fetal movement during MAU stay. Strict return precautions reviewed. Will rx terazol. Patient to keep OB appointment as scheduled on 12/27.  Assessment and Plan  [redacted] weeks gestation of pregnancy Yeast vaginitis NST reactive Intact amniotic membranes  - Discharge home in stable condition - Rx for Terazol to pharmacy - Strict precautions reviewed. Return to MAU as needed for new/worsening symptoms - Keep OB appointment as scheduled  1/28, CNM 08/26/2022, 1:47 PM

## 2022-08-26 NOTE — MAU Note (Signed)
...  April Murray is a 21 y.o. at [redacted]w[redacted]d here in MAU reporting: LOF since 0900 this morning. She reports she had a smaller gush and has continued to have small episodes of leaking since then. Denies VB. Endorses fetal movement but reports it has decreased over the past week.  GBS-.  Onset of complaint:  Pain score: 5/10 lower abdomen   FHT: 117 initial external Lab orders placed from triage:  Crist Fat

## 2022-08-30 ENCOUNTER — Encounter: Payer: Self-pay | Admitting: Obstetrics & Gynecology

## 2022-08-30 ENCOUNTER — Ambulatory Visit (INDEPENDENT_AMBULATORY_CARE_PROVIDER_SITE_OTHER): Payer: Medicaid Other | Admitting: Obstetrics & Gynecology

## 2022-08-30 VITALS — BP 134/83 | HR 83 | Wt 159.0 lb

## 2022-08-30 DIAGNOSIS — Z3483 Encounter for supervision of other normal pregnancy, third trimester: Secondary | ICD-10-CM | POA: Diagnosis not present

## 2022-08-30 DIAGNOSIS — Z3A38 38 weeks gestation of pregnancy: Secondary | ICD-10-CM | POA: Diagnosis not present

## 2022-08-30 NOTE — Progress Notes (Signed)
LOW-RISK PREGNANCY VISIT Patient name: April Murray MRN [id number]  Date of birth: 09/17/2000 Chief Complaint:   Routine Prenatal Visit  History of Present Illness:   April Murray is a 21 y.o. G42P1011 female at [redacted]w[redacted]d with an Estimated Date of Delivery: 09/09/22 being seen today for ongoing management of a low-risk pregnancy.      06/15/2022   10:55 AM 02/28/2022    2:07 PM 11/07/2016   11:06 AM  Depression screen PHQ 2/9  Decreased Interest 1 0 3  Down, Depressed, Hopeless 0 0 3  PHQ - 2 Score 1 0 6  Altered sleeping 2 1 0  Tired, decreased energy 1 1 3   Change in appetite 2 1 3   Feeling bad or failure about yourself  0 0 3  Trouble concentrating 0 0 0  Moving slowly or fidgety/restless 0 0 0  Suicidal thoughts 0 0 0  PHQ-9 Score 6 3 15     Today she reports occasional contractions. Contractions: Not present. Vag. Bleeding: None.  Movement: Present. denies leaking of fluid- lost mucus plug.  Pt seen in MAU for rule out rupture. Review of Systems:   Pertinent items are noted in HPI Denies abnormal vaginal discharge w/ itching/odor/irritation, headaches, visual changes, shortness of breath, chest pain, abdominal pain, severe nausea/vomiting, or problems with urination or bowel movements unless otherwise stated above. Pertinent History Reviewed:  Reviewed past medical,surgical, social, obstetrical and family history.  Reviewed problem list, medications and allergies.  Physical Assessment:   Vitals:   08/30/22 1154  BP: 134/83  Pulse: 83  Weight: 159 lb (72.1 kg)  Body mass index is 28.17 kg/m.        Physical Examination:   General appearance: Well appearing, and in no distress  Mental status: Alert, oriented to person, place, and time  Skin: Warm & dry  Respiratory: Normal respiratory effort, no distress  Abdomen: Soft, gravid, nontender  Pelvic: Cervical exam performed  Dilation: 2.5 Effacement (%): 30 Station: -3  Extremities: Edema: None  Psych:  mood and  affect appropriate  Fetal Status: Fetal Heart Rate (bpm): 130 Fundal Height: 36 cm Movement: Present    Chaperone:  pt declined- partner present      No results found for this or any previous visit (from the past 24 hour(s)).   Assessment & Plan:  1) Low-risk pregnancy G3P1011 at [redacted]w[redacted]d with an Estimated Date of Delivery: 09/09/22   -expectant care   Meds: No orders of the defined types were placed in this encounter.  Labs/procedures today: none  Plan:  Continue routine obstetrical care  Next visit: prefers in person    Reviewed: Term labor symptoms and general obstetric precautions including but not limited to vaginal bleeding, contractions, leaking of fluid and fetal movement were reviewed in detail with the patient.  All questions were answered. Pt has home bp cuff. Check bp weekly, let 09/01/22 know if >140/90.   Follow-up: Return in about 1 week (around 09/06/2022) for LROB visit.  No orders of the defined types were placed in this encounter.   11/08/22, DO Attending Obstetrician & Gynecologist, [facility] for 11/05/2022, [facility]

## 2022-09-01 ENCOUNTER — Inpatient Hospital Stay (HOSPITAL_COMMUNITY)
Admission: AD | Admit: 2022-09-01 | Discharge: 2022-09-02 | DRG: 776 | Disposition: A | Discharge status: Home / Self Care | Payer: Medicaid Other | Attending: Obstetrics & Gynecology | Admitting: Obstetrics & Gynecology

## 2022-09-01 ENCOUNTER — Encounter (HOSPITAL_COMMUNITY): Payer: Self-pay | Admitting: Obstetrics & Gynecology

## 2022-09-01 DIAGNOSIS — Z3A38 38 weeks gestation of pregnancy: Secondary | ICD-10-CM

## 2022-09-01 LAB — CBC
HCT: 31 % — ABNORMAL LOW (ref 36.0–46.0)
Hemoglobin: 9.8 g/dL — ABNORMAL LOW (ref 12.0–15.0)
MCH: 25.1 pg — ABNORMAL LOW (ref 26.0–34.0)
MCHC: 31.6 g/dL (ref 30.0–36.0)
MCV: 79.3 fL — ABNORMAL LOW (ref 80.0–100.0)
Platelets: 223 10*3/uL (ref 150–400)
RBC: 3.91 MIL/uL (ref 3.87–5.11)
RDW: 14.1 % (ref 11.5–15.5)
WBC: 10.1 10*3/uL (ref 4.0–10.5)
nRBC: 0 % (ref 0.0–0.2)

## 2022-09-01 LAB — RPR: RPR Ser Ql: NONREACTIVE

## 2022-09-01 MED ORDER — WITCH HAZEL-GLYCERIN EX PADS: 1.0000 | MEDICATED_PAD | CUTANEOUS | Status: DC | PRN

## 2022-09-01 MED ORDER — SENNOSIDES-DOCUSATE SODIUM 8.6-50 MG PO TABS
2.0000 | ORAL_TABLET | ORAL | Status: DC
  Administered 2022-09-01 – 2022-09-02 (×2): 2 via ORAL
  Filled 2022-09-01 (×2): qty 2

## 2022-09-01 MED ORDER — DIBUCAINE (PERIANAL) 1 % EX OINT: 1.0000 | TOPICAL_OINTMENT | CUTANEOUS | Status: DC | PRN

## 2022-09-01 MED ORDER — BENZOCAINE-MENTHOL 20-0.5 % EX AERO
1.0000 | INHALATION_SPRAY | CUTANEOUS | Status: DC | PRN
  Administered 2022-09-01: 1 via TOPICAL
  Filled 2022-09-01: qty 56

## 2022-09-01 MED ORDER — TETANUS-DIPHTH-ACELL PERTUSSIS 5-2.5-18.5 LF-MCG/0.5 IM SUSY: 0.5000 mL | PREFILLED_SYRINGE | Freq: Once | INTRAMUSCULAR | Status: DC

## 2022-09-01 MED ORDER — ACETAMINOPHEN 325 MG PO TABS: 650.0000 mg | ORAL_TABLET | ORAL | Status: DC | PRN

## 2022-09-01 MED ORDER — LACTATED RINGERS IV SOLN: INTRAVENOUS | Status: DC

## 2022-09-01 MED ORDER — OXYTOCIN-SODIUM CHLORIDE 30-0.9 UT/500ML-% IV SOLN: 2.5000 [IU]/h | INTRAVENOUS | Status: DC

## 2022-09-01 MED ORDER — OXYTOCIN BOLUS FROM INFUSION: 333.0000 mL | Freq: Once | INTRAVENOUS | Status: DC

## 2022-09-01 MED ORDER — PRENATAL MULTIVITAMIN CH
1.0000 | ORAL_TABLET | Freq: Every day | ORAL | Status: DC
  Administered 2022-09-01 – 2022-09-02 (×2): 1 via ORAL
  Filled 2022-09-01 (×2): qty 1

## 2022-09-01 MED ORDER — ONDANSETRON HCL 4 MG/2ML IJ SOLN: 4.0000 mg | Freq: Four times a day (QID) | INTRAMUSCULAR | Status: DC | PRN

## 2022-09-01 MED ORDER — OXYCODONE HCL 5 MG PO TABS
5.0000 mg | ORAL_TABLET | ORAL | Status: DC | PRN
  Administered 2022-09-01: 5 mg via ORAL
  Filled 2022-09-01: qty 1

## 2022-09-01 MED ORDER — ZOLPIDEM TARTRATE 5 MG PO TABS: 5.0000 mg | ORAL_TABLET | Freq: Every evening | ORAL | Status: DC | PRN

## 2022-09-01 MED ORDER — OXYCODONE-ACETAMINOPHEN 5-325 MG PO TABS: 2.0000 | ORAL_TABLET | ORAL | Status: DC | PRN

## 2022-09-01 MED ORDER — ONDANSETRON HCL 4 MG PO TABS: 4.0000 mg | ORAL_TABLET | ORAL | Status: DC | PRN

## 2022-09-01 MED ORDER — LIDOCAINE HCL (PF) 1 % IJ SOLN: 30.0000 mL | INTRAMUSCULAR | Status: DC | PRN

## 2022-09-01 MED ORDER — SOD CITRATE-CITRIC ACID 500-334 MG/5ML PO SOLN: 30.0000 mL | ORAL | Status: DC | PRN

## 2022-09-01 MED ORDER — ONDANSETRON HCL 4 MG/2ML IJ SOLN: 4.0000 mg | INTRAMUSCULAR | Status: DC | PRN

## 2022-09-01 MED ORDER — LACTATED RINGERS IV SOLN: 500.0000 mL | INTRAVENOUS | Status: DC | PRN

## 2022-09-01 MED ORDER — DIPHENHYDRAMINE HCL 25 MG PO CAPS: 25.0000 mg | ORAL_CAPSULE | Freq: Four times a day (QID) | ORAL | Status: DC | PRN

## 2022-09-01 MED ORDER — ERYTHROMYCIN 5 MG/GM OP OINT
TOPICAL_OINTMENT | OPHTHALMIC | Status: AC
  Filled 2022-09-01: qty 1

## 2022-09-01 MED ORDER — SIMETHICONE 80 MG PO CHEW: 80.0000 mg | CHEWABLE_TABLET | ORAL | Status: DC | PRN

## 2022-09-01 MED ORDER — IBUPROFEN 600 MG PO TABS
600.0000 mg | ORAL_TABLET | Freq: Four times a day (QID) | ORAL | Status: DC
  Administered 2022-09-01 – 2022-09-02 (×6): 600 mg via ORAL
  Filled 2022-09-01 (×6): qty 1

## 2022-09-01 MED ORDER — COCONUT OIL OIL
1.0000 | TOPICAL_OIL | Status: DC | PRN
  Administered 2022-09-02: 1 via TOPICAL

## 2022-09-01 MED ORDER — OXYCODONE HCL 5 MG PO TABS: 10.0000 mg | ORAL_TABLET | ORAL | Status: DC | PRN

## 2022-09-01 MED ORDER — OXYCODONE-ACETAMINOPHEN 5-325 MG PO TABS: 1.0000 | ORAL_TABLET | ORAL | Status: DC | PRN

## 2022-09-01 NOTE — MAU Provider Note (Cosign Needed)
Chief Complaint:  No chief complaint on file.   Seen by provider at 0335   HPI: April Murray is a 21 y.o. G3P1011 at 18w6dwho presents via private car at entrance to [facility] having just delivered precipitously in the front seat of her car at 0329.   States started laboring at home and left for hospital   Her water broke while en route and she felt the baby coming as they turned into hospital driveway.  FOB states baby delivered "as I was putting on the parking brake"    RN was at carside immediately after birth and assisted the patient in wrapping her baby in the blanket they had in the car.    She reports good fetal movement, denies LOF, vaginal bleeding, vaginal itching/burning, urinary symptoms, h/a, dizziness, n/v, diarrhea, constipation or fever/chills.  She denies headache, visual changes or RUQ abdominal pain.  Abdominal Pain This is a new problem. The current episode started today. The onset quality is gradual. The problem has been unchanged. The quality of the pain is cramping. Pertinent negatives include no diarrhea, fever, nausea or vomiting. Nothing aggravates the pain. The pain is relieved by Nothing. She has tried nothing for the symptoms.    Past Medical History: Past Medical History:  Diagnosis Date   Depression    Miscarriage    Scoliosis    Seasonal allergies    UTI (lower urinary tract infection)     Past obstetric history: OB History  Gravida Para Term Preterm AB Living  3 1 1   1 1   SAB IAB Ectopic Multiple Live Births  1       1    # Outcome Date GA Lbr Len/2nd Weight Sex Delivery Anes PTL Lv  3 Current           2 Term 04/01/20 [redacted]w[redacted]d  3515 g M Vag-Spont EPI N LIV  1 SAB 2020            Past Surgical History: Past Surgical History:  Procedure Laterality Date   TONSILLECTOMY AND ADENOIDECTOMY      Family History: Family History  Problem Relation Age of Onset   Asthma Mother    Syncope episode Mother    Seizures Mother    Bipolar disorder Mother     Depression Mother    Anxiety disorder Mother    Migraines Father    Healthy Brother    ADD / ADHD Brother    Depression Maternal Aunt    Bipolar disorder Maternal Uncle    Schizophrenia Maternal Uncle    Other Maternal Uncle        DDD   Mental illness Maternal Uncle    Arthritis Maternal Grandmother    Depression Maternal Grandmother    Stroke Maternal Grandfather    Arthritis Maternal Grandfather    Other Maternal Grandfather        DDD   Hypertension Maternal Grandfather    Hyperlipidemia Paternal Grandmother    Alcoholism Paternal Grandfather    Hypertension Paternal Grandfather    Bipolar disorder Other    COPD Other    Endometriosis Other    Polycystic ovary syndrome Other    Other Other        DDD   Irritable bowel syndrome Other    Other Other        Inter cystitis   Migraines Other    Lung cancer Other        Paternal side Aunts & Uncles   Autism  Neg Hx     Social History: Social History   Tobacco Use   Smoking status: Former    Types: Cigarettes    Passive exposure: Yes   Smokeless tobacco: Never   Tobacco comments:    Mother smokes in her room  Vaping Use   Vaping Use: Former  Substance Use Topics   Alcohol use: Not Currently    Comment: every 2 weeks   Drug use: Not Currently    Allergies:  Allergies  Allergen Reactions   Penicillins     Itching and swelling    Meds:  Medications Prior to Admission  Medication Sig Dispense Refill Last Dose   acetaminophen (TYLENOL) 500 MG tablet Take 1,000 mg by mouth every 6 (six) hours as needed for mild pain, moderate pain or headache.      Blood Pressure Monitor MISC For regular home bp monitoring during pregnancy (Patient not taking: Reported on 08/16/2022) 1 each 0    Esomeprazole Magnesium 20 MG TBEC Take 1 tablet by mouth daily. 90 tablet 1    Prenatal Vit-Fe Fumarate-FA (PRENATAL VITAMIN PO) Take by mouth.      terconazole (TERAZOL 7) 0.4 % vaginal cream Place 1 applicator vaginally at bedtime.  Use for seven days (Patient not taking: Reported on 08/30/2022) 45 g 0     I have reviewed patient's Past Medical Hx, Surgical Hx, Family Hx, Social Hx, medications and allergies.   ROS:  Review of Systems  Constitutional:  Negative for chills and fever.  Respiratory:  Negative for shortness of breath.   Gastrointestinal:  Positive for abdominal pain. Negative for diarrhea, nausea and vomiting.  Genitourinary:  Positive for vaginal bleeding and vaginal discharge.   Other systems negative  Physical Exam  Patient Vitals for the past 24 hrs:  BP Temp Temp src Pulse SpO2  09/01/22 0430 111/74 -- -- 83 96 %  09/01/22 0415 113/74 -- -- 71 96 %  09/01/22 0410 -- -- -- -- 95 %  09/01/22 0405 -- -- -- -- 90 %  09/01/22 0400 115/74 -- -- 76 95 %  09/01/22 0355 -- -- -- -- 96 %  09/01/22 0350 -- -- -- -- 95 %  09/01/22 0348 129/74 98.6 F (37 C) Oral 74 --  09/01/22 0345 -- -- -- -- 96 %   Constitutional: Well-developed, well-nourished female in no acute distress.  Cardiovascular: normal rate and rhythm Respiratory: normal effort GI: Abd soft, non-tender, gravid appropriate for gestational age.   No rebound or guarding. MS: Extremities nontender, no edema, normal ROM Neurologic: Alert and oriented x 4.  GU: Neg CVAT.  PELVIC EXAM: deferred until delivery of placenta  Labs: No results found for this or any previous visit (from the past 24 hour(s)).  O/Positive/-- (06/27 1553)  Imaging:    MAU Course/MDM: I have reviewed the triage vital signs and the nursing notes.   Pertinent labs & imaging results that were available during my care of the patient were reviewed by me and considered in my medical decision making (see chart for details).      I have reviewed her medical records including past results, notes and treatments.   I have ordered labor and delivery orders and Postpartum orders Consult Dr Nelda Marseille with presentation, exam findings and test results.  Treatments in MAU  included Delivery management and postpartum recovery.    Assessment: Single IUP at [redacted]w[redacted]d Precipitous Delivery, delivered  Plan: Admit to Postpartum for routine care Routine orders   Hansel Feinstein  CNM, MSN Certified Nurse-Midwife 09/01/2022 4:52 AM

## 2022-09-01 NOTE — MAU Note (Signed)
Emergency call to unit about patient in loop needing assistance. This RN and others rushed to valet loop at entrance and FOB stated baby was in the seat. RN found mom on sit attempting to stand and baby in seat still attached to mom by umbilical cord and placenta not yet delivered. RN wraps baby in blanket and mom is assisted into wheelchair. April Murray, CNM met at car. Patient and baby brought directly into room with CNM.  

## 2022-09-01 NOTE — Social Work (Signed)
MOB was referred for history of depression/anxiety.  * Referral screened out by Clinical Social Worker because none of the following criteria appear to apply:  ~ History of anxiety/depression during this pregnancy, or of post-partum depression following prior delivery.  ~ Diagnosis of anxiety and/or depression within last 3 years OR * MOB's symptoms currently being treated with medication and/or therapy.  Per chart review, MOB depression dates back to 2017. No noted concerns during this pregnancy.   Please contact the Clinical Social Worker if needs arise or by MOB request.  Wende Neighbors, LCSWA Clinical Social Worker [phone]

## 2022-09-01 NOTE — Lactation Note (Signed)
This note was copied from a baby's chart. Lactation Consultation Note  Patient Name: April Murray LYHTM'B Date: 09/01/2022 Reason for consult: Initial assessment Age:23 hours  P2, Baby recently breastfed fro 25 min. Feed on demand with cues.  Goal 8-12+ times per day after first 24 hrs.  Place baby STS if not cueing.  Mom made aware of O/P services, breastfeeding support groups, community resources, and our phone # for post-discharge questions.  Suggest calling for help as needed.  Maternal Data Has patient been taught Hand Expression?: Yes Does the patient have breastfeeding experience prior to this delivery?: Yes How long did the patient breastfeed?: 6 mso  Feeding Mother's Current Feeding Choice: Breast Milk  LATCH Score Latch: Repeated attempts needed to sustain latch, nipple held in mouth throughout feeding, stimulation needed to elicit sucking reflex.  Audible Swallowing: A few with stimulation  Type of Nipple: Everted at rest and after stimulation  Comfort (Breast/Nipple): Soft / non-tender  Hold (Positioning): No assistance needed to correctly position infant at breast.  LATCH Score: 8  Interventions Interventions: Education;LC Services brochure Consult Status Consult Status: Follow-up Date: 09/02/22 Follow-up type: In-patient    Dahlia Byes [facility] 09/01/2022, 8:13 AM

## 2022-09-01 NOTE — Discharge Summary (Signed)
Postpartum Discharge Summary    Patient Name: April Murray DOB: Feb 13, 2001 MRN: [id number]  Date of admission: 09/01/2022 Delivery date:09/01/2022  Delivering provider: Seabron Spates  Date of discharge: 09/02/2022  Admitting diagnosis: Precipitous delivery, delivered (current hospitalization) [O62.3] Intrauterine pregnancy: [redacted]w[redacted]d    Secondary diagnosis:  Principal Problem:   Precipitous delivery, delivered (current hospitalization)  Additional problems: none    Discharge diagnosis: Term Pregnancy Delivered                                              Postpartum procedures: None Augmentation:  none Complications: None  Hospital course: Onset of Labor With Vaginal Delivery      21y.o. yo [id number] admitted after delivery [facility]. Labor course: Patient presented at entrance to hospital having just delivered precipitously as they pulled in. Baby was healthy, placenta delivered in MAU and patient taken to postpartum unit for recovery.    Membrane Rupture Time/Date: 3:29 AM ,09/01/2022   Delivery Method:Vaginal, Spontaneous  Episiotomy: None  Lacerations:  None  Patient had a postpartum course complicated by none.  She is ambulating, tolerating a regular diet, passing flatus, and urinating well. Patient is discharged home in stable condition on 09/02/22.  Newborn Data: Birth date:09/01/2022  Birth time:3:29 AM  Gender:Female  Living status:Living  Apgars:8 ,9  Weight:7 lb 10.9 oz (3.485 kg) (7lb 10.9oz)  Magnesium Sulfate received: No BMZ received: No Rhophylac:N/A MMR:N/A T-DaP:Given prenatally Flu: No Transfusion:No  Physical exam  Vitals:   09/01/22 1721 09/01/22 2158 09/02/22 0621 09/02/22 1357  BP: 102/78 116/76 109/68 115/77  Pulse: 80 79 70 86  Resp: _0 Temp: 98 F (36.7 C) 98.1 F (36.7 C) 97.8 F (36.6 C) 98 F (36.7 C)  TempSrc: Axillary Oral Oral Oral  SpO2: 99% 99% 98% 100%    General: alert, cooperative, and no distress Lochia: appropriate Uterine Fundus: firm Incision: N/A DVT Evaluation: No evidence of DVT seen on physical exam.  Labs: Lab Results  Component Value Date   WBC 10.1 09/01/2022   HGB 9.8 (L) 09/01/2022   HCT 31.0 (L) 09/01/2022   MCV 79.3 (L) 09/01/2022   PLT 223 09/01/2022      Latest Ref Rng & Units 08/07/2018    1:30 PM  CMP  Glucose 70 - 99 mg/dL 89   BUN 6 - 23 mg/dL 5   Creatinine 0.40 - 1.20 mg/dL 0.54   Sodium 135 - 145 mEq/L 140   Potassium 3.5 - 5.1 mEq/L 3.9   Chloride 96 - 112 mEq/L 108   CO2 19 - 32 mEq/L 25   Calcium 8.4 - 10.5 mg/dL 9.2   Total Protein 6.0 - 8.3 g/dL 6.6   Total Bilirubin 0.2 - 1.2 mg/dL 0.5   Alkaline Phos 47 - 119 U/L 61   AST 0 - 37 U/L 12   ALT 0 - 35 U/L 9    Edinburgh Score:    09/01/2022   10:00 AM  Edinburgh Postnatal Depression Scale Screening Tool  I have been able to laugh and see the funny side of things. 0  I have looked forward with enjoyment to things. 0  I have blamed myself unnecessarily when things went wrong. 0  I have been anxious or worried for no  good reason. 0  I have felt scared or panicky for no good reason. 0  Things have been getting on top of me. 0  I have been so unhappy that I have had difficulty sleeping. 0  I have felt sad or miserable. 0  I have been so unhappy that I have been crying. 0  The thought of harming myself has occurred to me. 0  Edinburgh Postnatal Depression Scale Total 0   After visit meds:  Allergies as of 09/02/2022       Reactions   Penicillins    Itching and swelling        Medication List     STOP taking these medications    Blood Pressure Monitor Misc   terconazole 0.4 % vaginal cream Commonly known as: TERAZOL 7       TAKE these medications    acetaminophen 500 MG tablet Commonly known as: TYLENOL Take 1,000 mg by mouth every 6 (six) hours as needed for mild pain, moderate pain or headache.   Esomeprazole  Magnesium 20 MG Tbec Take 1 tablet by mouth daily.   ibuprofen 600 MG tablet Commonly known as: ADVIL Take 1 tablet (600 mg total) by mouth every 6 (six) hours.   PRENATAL VITAMIN PO Take by mouth.       Discharge home in stable condition Infant Feeding: Breast Infant Disposition:home with mother Discharge instruction: per After Visit Summary and Postpartum booklet. Activity: Advance as tolerated. Pelvic rest for 6 weeks.  Diet: routine diet  Future Appointments: Future Appointments  Date Time Provider [place]  10/06/2022  9:10 AM Luvenia Redden, PA-C [facility]   Follow up Visit: (PP visit scheduled)  09/02/2022 Gabriel Carina, CNM

## 2022-09-01 NOTE — Progress Notes (Signed)
Spoke with Wynelle Bourgeois, CNM during shift report. Patient did not have any labs drawn this morning prior to transferring to mother baby. Marie Ordered RPR and CBC only for patient. Earl Gala, Linda Hedges [place]

## 2022-09-02 ENCOUNTER — Other Ambulatory Visit: Payer: Self-pay

## 2022-09-02 ENCOUNTER — Encounter (HOSPITAL_COMMUNITY): Payer: Self-pay | Admitting: Obstetrics & Gynecology

## 2022-09-02 MED ORDER — IBUPROFEN 600 MG PO TABS: 600.0000 mg | ORAL_TABLET | Freq: Four times a day (QID) | ORAL | 0 refills | Status: DC

## 2022-09-02 NOTE — Plan of Care (Signed)
Problem: Education: Goal: Knowledge of condition will improve 09/02/2022 1713 by Rodolph Bong, LPN Outcome: Adequate for Discharge 09/02/2022 1625 by Rodolph Bong, LPN Outcome: Progressing Goal: Individualized Educational Video(s) 09/02/2022 1713 by Rodolph Bong, LPN Outcome: Adequate for Discharge 09/02/2022 1625 by Rodolph Bong, LPN Outcome: Progressing Goal: Individualized Newborn Educational Video(s) 09/02/2022 1713 by Rodolph Bong, LPN Outcome: Adequate for Discharge 09/02/2022 1625 by Rodolph Bong, LPN Outcome: Progressing   Problem: Coping: Goal: Ability to identify and utilize available resources and services will improve 09/02/2022 1713 by Rodolph Bong, LPN Outcome: Adequate for Discharge 09/02/2022 1625 by Rodolph Bong, LPN Outcome: Progressing   Problem: Life Cycle: Goal: Chance of risk for complications during the postpartum period will decrease 09/02/2022 1713 by Rodolph Bong, LPN Outcome: Adequate for Discharge 09/02/2022 1625 by Rodolph Bong, LPN Outcome: Progressing   Problem: Skin Integrity: Goal: Demonstration of wound healing without infection will improve 09/02/2022 1713 by Rodolph Bong, LPN Outcome: Adequate for Discharge 09/02/2022 1625 by Rodolph Bong, LPN Outcome: Progressing   Problem: Education: Goal: Knowledge of General Education information will improve Description: Including pain rating scale, medication(s)/side effects and non-pharmacologic comfort measures 09/02/2022 1713 by Rodolph Bong, LPN Outcome: Adequate for Discharge 09/02/2022 1625 by Rodolph Bong, LPN Outcome: Progressing   Problem: Health Behavior/Discharge Planning: Goal: Ability to manage health-related needs will improve 09/02/2022 1713 by Rodolph Bong, LPN Outcome: Adequate for Discharge 09/02/2022 1625 by Rodolph Bong, LPN Outcome: Progressing   Problem: Clinical Measurements: Goal: Ability to maintain clinical measurements within  normal limits will improve 09/02/2022 1713 by Rodolph Bong, LPN Outcome: Adequate for Discharge 09/02/2022 1625 by Rodolph Bong, LPN Outcome: Progressing Goal: Will remain free from infection 09/02/2022 1713 by Rodolph Bong, LPN Outcome: Adequate for Discharge 09/02/2022 1625 by Rodolph Bong, LPN Outcome: Progressing Goal: Diagnostic test results will improve 09/02/2022 1713 by Rodolph Bong, LPN Outcome: Adequate for Discharge 09/02/2022 1625 by Rodolph Bong, LPN Outcome: Progressing Goal: Respiratory complications will improve 09/02/2022 1713 by Rodolph Bong, LPN Outcome: Adequate for Discharge 09/02/2022 1625 by Rodolph Bong, LPN Outcome: Progressing Goal: Cardiovascular complication will be avoided 09/02/2022 1713 by Rodolph Bong, LPN Outcome: Adequate for Discharge 09/02/2022 1625 by Rodolph Bong, LPN Outcome: Progressing   Problem: Activity: Goal: Risk for activity intolerance will decrease 09/02/2022 1713 by Rodolph Bong, LPN Outcome: Adequate for Discharge 09/02/2022 1625 by Rodolph Bong, LPN Outcome: Progressing   Problem: Nutrition: Goal: Adequate nutrition will be maintained 09/02/2022 1713 by Rodolph Bong, LPN Outcome: Adequate for Discharge 09/02/2022 1625 by Rodolph Bong, LPN Outcome: Progressing   Problem: Elimination: Goal: Will not experience complications related to bowel motility Outcome: Adequate for Discharge   Problem: Pain Managment: Goal: General experience of comfort will improve Outcome: Adequate for Discharge   Problem: Safety: Goal: Ability to remain free from injury will improve Outcome: Adequate for Discharge   Problem: Skin Integrity: Goal: Risk for impaired skin integrity will decrease Outcome: Adequate for Discharge   Problem: Coping: Goal: Ability to identify and utilize available resources and services will improve 09/02/2022 1713 by Rodolph Bong, LPN Outcome: Adequate for Discharge 09/02/2022  1625 by Rodolph Bong, LPN Outcome: Progressing   Problem: Life Cycle: Goal: Chance of risk for complications during the postpartum period will decrease 09/02/2022 1713 by Rodolph Bong, LPN Outcome: Adequate for Discharge 09/02/2022 1625 by Rodolph Bong,  LPN Outcome: Progressing   Problem: Role Relationship: Goal: Ability to demonstrate positive interaction with newborn will improve 09/02/2022 1713 by Donne Hazel, LPN Outcome: Adequate for Discharge 09/02/2022 1625 by Donne Hazel, LPN Outcome: Progressing   Problem: Skin Integrity: Goal: Demonstration of wound healing without infection will improve 09/02/2022 1713 by Donne Hazel, LPN Outcome: Adequate for Discharge 09/02/2022 1625 by Donne Hazel, LPN Outcome: Progressing   Problem: Education: Goal: Knowledge of condition will improve 09/02/2022 1713 by Donne Hazel, LPN Outcome: Adequate for Discharge 09/02/2022 1625 by Donne Hazel, LPN Outcome: Progressing Goal: Individualized Educational Video(s) 09/02/2022 1713 by Donne Hazel, LPN Outcome: Adequate for Discharge 09/02/2022 1625 by Donne Hazel, LPN Outcome: Progressing Goal: Individualized Newborn Educational Video(s) 09/02/2022 1713 by Donne Hazel, LPN Outcome: Adequate for Discharge 09/02/2022 1625 by Donne Hazel, LPN Outcome: Progressing   Problem: Activity: Goal: Will verbalize the importance of balancing activity with adequate rest periods 09/02/2022 1713 by Donne Hazel, LPN Outcome: Adequate for Discharge 09/02/2022 1625 by Donne Hazel, LPN Outcome: Progressing Goal: Ability to tolerate increased activity will improve 09/02/2022 1713 by Donne Hazel, LPN Outcome: Adequate for Discharge 09/02/2022 1625 by Donne Hazel, LPN Outcome: Progressing   Problem: Coping: Goal: Ability to identify and utilize available resources and services will improve 09/02/2022 1713 by Donne Hazel, LPN Outcome: Adequate for  Discharge 09/02/2022 1625 by Donne Hazel, LPN Outcome: Progressing   Problem: Life Cycle: Goal: Chance of risk for complications during the postpartum period will decrease 09/02/2022 1713 by Donne Hazel, LPN Outcome: Adequate for Discharge 09/02/2022 1625 by Donne Hazel, LPN Outcome: Progressing   Problem: Role Relationship: Goal: Ability to demonstrate positive interaction with newborn will improve 09/02/2022 1713 by Donne Hazel, LPN Outcome: Adequate for Discharge 09/02/2022 1625 by Donne Hazel, LPN Outcome: Progressing   Problem: Skin Integrity: Goal: Demonstration of wound healing without infection will improve 09/02/2022 1713 by Donne Hazel, LPN Outcome: Adequate for Discharge 09/02/2022 1625 by Donne Hazel, LPN Outcome: Progressing

## 2022-09-02 NOTE — Plan of Care (Signed)
  Problem: Education: Goal: Knowledge of condition will improve Outcome: Progressing Goal: Individualized Educational Video(s) Outcome: Progressing Goal: Individualized Newborn Educational Video(s) Outcome: Progressing   Problem: Coping: Goal: Ability to identify and utilize available resources and services will improve Outcome: Progressing   Problem: Life Cycle: Goal: Chance of risk for complications during the postpartum period will decrease Outcome: Progressing   Problem: Skin Integrity: Goal: Demonstration of wound healing without infection will improve Outcome: Progressing   Problem: Education: Goal: Knowledge of General Education information will improve Description: Including pain rating scale, medication(s)/side effects and non-pharmacologic comfort measures Outcome: Progressing   Problem: Health Behavior/Discharge Planning: Goal: Ability to manage health-related needs will improve Outcome: Progressing   Problem: Clinical Measurements: Goal: Ability to maintain clinical measurements within normal limits will improve Outcome: Progressing Goal: Will remain free from infection Outcome: Progressing Goal: Diagnostic test results will improve Outcome: Progressing Goal: Respiratory complications will improve Outcome: Progressing Goal: Cardiovascular complication will be avoided Outcome: Progressing   Problem: Activity: Goal: Risk for activity intolerance will decrease Outcome: Progressing   Problem: Nutrition: Goal: Adequate nutrition will be maintained Outcome: Progressing   Problem: Coping: Goal: Ability to identify and utilize available resources and services will improve Outcome: Progressing   Problem: Life Cycle: Goal: Chance of risk for complications during the postpartum period will decrease Outcome: Progressing   Problem: Role Relationship: Goal: Ability to demonstrate positive interaction with newborn will improve Outcome: Progressing   Problem:  Skin Integrity: Goal: Demonstration of wound healing without infection will improve Outcome: Progressing   Problem: Education: Goal: Knowledge of condition will improve Outcome: Progressing Goal: Individualized Educational Video(s) Outcome: Progressing Goal: Individualized Newborn Educational Video(s) Outcome: Progressing   Problem: Activity: Goal: Will verbalize the importance of balancing activity with adequate rest periods Outcome: Progressing Goal: Ability to tolerate increased activity will improve Outcome: Progressing   Problem: Coping: Goal: Ability to identify and utilize available resources and services will improve Outcome: Progressing   Problem: Life Cycle: Goal: Chance of risk for complications during the postpartum period will decrease Outcome: Progressing   Problem: Role Relationship: Goal: Ability to demonstrate positive interaction with newborn will improve Outcome: Progressing   Problem: Skin Integrity: Goal: Demonstration of wound healing without infection will improve Outcome: Progressing

## 2022-09-02 NOTE — Lactation Note (Signed)
This note was copied from a baby's chart. Lactation Consultation Note  Patient Name: April Murray [id number]'O Date: 09/02/2022 Reason for consult: Follow-up assessment;Early term 37-38.6wks;Infant weight loss;Nipple pain/trauma (5 % weight loss, per mom post circ, due to feed. LC offered to assist, mom receptive. Checked diaper, dry and baby awake to latched for 3 mins with swallows, depth and per mom comfortable / released. nipple well rounded. MIlk coming in, easily hand expr.) Age:50 hours LC reviewed the steps for latching, and sore nipple tx, EBM to nipples and coconut oil afterwards. Shells while awake.  LC reviewed BF D/C teaching and LC resources.  LC recommended using the steps for latching and the football position until the soreness improves. If the sore  nipples aren't cleared up by 4 days to call for [facility] O/P number. Mom has the numbers.  Maternal Data Has patient been taught Hand Expression?: Yes  Feeding Mother's Current Feeding Choice: Breast Milk  LATCH Score Latch: Grasps breast easily, tongue down, lips flanged, rhythmical sucking.  Audible Swallowing: Spontaneous and intermittent  Type of Nipple: Everted at rest and after stimulation (intact positional strips nopted on both nipples and some areola edema. LC reviewed the steps for latching prior to the latch, reverse pressure stretch back .)  Comfort (Breast/Nipple): Soft / non-tender  Hold (Positioning): Assistance needed to correctly position infant at breast and maintain latch.  LATCH Score: 9   Lactation Tools Discussed/Used Tools: Pump;Shells;Flanges Flange Size: 21;24 Breast pump type: Manual Pump Education: Milk Storage;Setup, frequency, and cleaning Reason for Pumping: LC reviewed steps for latching,  Interventions Interventions: Breast feeding basics reviewed;Assisted with latch;Skin to skin;Hand express;Reverse pressure;Breast compression;Adjust position;Support pillows;Shells;Hand pump;Education;LC  Services brochure  Discharge Discharge Education: Engorgement and breast care;Warning signs for feeding baby Pump: Manual;Personal  Consult Status Consult Status: Complete Date: 09/02/22    Kathrin Greathouse 09/02/2022, 3:44 PM

## 2022-09-06 ENCOUNTER — Encounter: Payer: Medicaid Other | Admitting: Advanced Practice Midwife

## 2022-09-09 ENCOUNTER — Telehealth (HOSPITAL_COMMUNITY): Payer: Self-pay

## 2022-09-09 NOTE — Telephone Encounter (Signed)
Patient reports feeling good. Patient declines questions/concerns about her health and healing.  Patient reports that baby is doing well. Eating, peeing/pooping, and gaining weight well. Baby sleeps in a crib. RN reviewed ABC's of safe sleep with patient. Patient declines any questions or concerns about baby.  EPDS score is 4.  Sharyn Lull [facility] 09/09/2022,1401

## 2022-10-06 ENCOUNTER — Ambulatory Visit (INDEPENDENT_AMBULATORY_CARE_PROVIDER_SITE_OTHER): Payer: Medicaid Other | Admitting: Advanced Practice Midwife

## 2022-10-06 ENCOUNTER — Encounter: Payer: Self-pay | Admitting: Advanced Practice Midwife

## 2022-10-06 MED ORDER — PHEXXI 1.8-1-0.4 % VA GEL: 1.0000 | Freq: Every day | VAGINAL | 4 refills | Status: AC

## 2022-10-06 NOTE — Progress Notes (Signed)
POSTPARTUM VISIT Patient name: April Murray MRN [id number]  Date of birth: November 15, 2000 Chief Complaint:   Postpartum Care ([facility])  History of Present Illness:   April Murray is a 22 y.o. [phone] Caucasian female being seen today for a postpartum visit. She is 5 weeks postpartum following a spontaneous vaginal delivery at 38.6 gestational weeks. IOL: no, for n/a. Anesthesia: none.  Laceration: none.  Complications: delivered in the car precipitously as she was pulling up to [facility]. Inpatient contraception: no.   Pregnancy uncomplicated. Tobacco use: former . Substance use disorder: no. Last pap smear: Nov 2022 and results were NILM w/ HRHPV not done. Next pap smear due: Nov 2025 No LMP recorded.  Postpartum course has been uncomplicated. Bleeding none. Bowel function is normal. Bladder function is normal. Urinary incontinence? no, fecal incontinence? no Patient is sexually active. Last sexual activity:  last week- used condom . Desired contraception:  Phexxi (didn't do well w pills) . Patient does not know about a pregnancy in the future.  Desired family size is unsure number of children.   Upstream - 10/06/22 0914       Pregnancy Intention Screening   Does the patient want to become pregnant in the next year? No    Does the patient's partner want to become pregnant in the next year? No    Would the patient like to discuss contraceptive options today? Yes      Contraception Wrap Up   Current Method Female Condom    End Method Female Condom    Contraception Counseling Provided Yes            The pregnancy intention screening data noted above was reviewed. Potential methods of contraception were discussed. The patient elected to proceed with Female Condom.  Edinburgh Postpartum Depression Screening: negative  Edinburgh Postnatal Depression Scale - 10/06/22 0911       Edinburgh Postnatal Depression Scale:  In the Past 7 Days   I have been able to laugh and see the funny side  of things. 0    I have looked forward with enjoyment to things. 0    I have blamed myself unnecessarily when things went wrong. 2    I have been anxious or worried for no good reason. 0    I have felt scared or panicky for no good reason. 0    Things have been getting on top of me. 1    I have been so unhappy that I have had difficulty sleeping. 0    I have felt sad or miserable. 0    I have been so unhappy that I have been crying. 0    The thought of harming myself has occurred to me. 0    Edinburgh Postnatal Depression Scale Total 3                06/15/2022   10:56 AM 02/28/2022    2:08 PM  GAD 7 : Generalized Anxiety Score  Nervous, Anxious, on Edge 1 1  Control/stop worrying 0 1  Worry too much - different things 1 1  Trouble relaxing 1 1  Restless 0 0  Easily annoyed or irritable 1 1  Afraid - awful might happen 0 0  Total GAD 7 Score 4 5     Baby's course has been uncomplicated. Baby is feeding by bottle. Infant has a pediatrician/family doctor? Yes.  Childcare strategy if returning to work/school:  not going to work until April at the  earliest .  Pt has material needs met for her and baby: Yes.   Review of Systems:   Pertinent items are noted in HPI Denies Abnormal vaginal discharge w/ itching/odor/irritation, headaches, visual changes, shortness of breath, chest pain, abdominal pain, severe nausea/vomiting, or problems with urination or bowel movements. Pertinent History Reviewed:  Reviewed past medical,surgical, obstetrical and family history.  Reviewed problem list, medications and allergies. OB History  Gravida Para Term Preterm AB Living  3 2 2   1 2   SAB IAB Ectopic Multiple Live Births  1     0 2    # Outcome Date GA Lbr Len/2nd Weight Sex Delivery Anes PTL Lv  3 Term 09/01/22 [redacted]w[redacted]d  7 lb 10.9 oz (3.485 kg) M Vag-Spont None  LIV  2 Term 04/01/20 [redacted]w[redacted]d  7 lb 12 oz (3.515 kg) M Vag-Spont EPI N LIV  1 SAB 2020           Physical Assessment:   Vitals:    10/06/22 0906  BP: 126/79  Pulse: 92  Weight: 139 lb 9.6 oz (63.3 kg)  Height: 5\' 3"  (1.6 m)  Body mass index is 24.73 kg/m.       Physical Examination:   General appearance: alert, well appearing, and in no distress  Mental status: alert, oriented to person, place, and time  Skin: warm & dry   Cardiovascular: normal heart rate noted   Respiratory: normal respiratory effort, no distress   Breasts: deferred, no complaints   Abdomen: soft, non-tender   Pelvic: examination not indicated. Thin prep pap obtained: No  Rectal: not examined  Extremities: Edema: none         No results found for this or any previous visit (from the past 24 hour(s)).  Assessment & Plan:  1) Postpartum exam 2) Five wks s/p spontaneous vaginal delivery 3) bottle feeding 4) Depression screening 5) Contraception management: rx Phexxi with pamphlet and instructions given  Essential components of care per ACOG recommendations:  1.  Mood and well being:  If positive depression screen, discussed and plan developed.  If using tobacco we discussed reduction/cessation and risk of relapse If current substance abuse, we discussed and referral to local resources was offered.   2. Infant care and feeding:  If breastfeeding, discussed returning to work, pumping, breastfeeding-associated pain, guidance regarding return to fertility while lactating if not using another method. If needed, patient was provided with a letter to be allowed to pump q 2-3hrs to support lactation in a private location with access to a refrigerator to store breastmilk.   Recommended that all caregivers be immunized for flu, pertussis and other preventable communicable diseases If pt does not have material needs met for her/baby, referred to local resources for help obtaining these.  3. Sexuality, contraception and birth spacing Provided guidance regarding sexuality, management of dyspareunia, and resumption of intercourse Discussed avoiding  interpregnancy interval <46mths and recommended birth spacing of 18 months  4. Sleep and fatigue Discussed coping options for fatigue and sleep disruption Encouraged family/partner/community support of 4 hrs of uninterrupted sleep to help with mood and fatigue  5. Physical recovery  If pt had a C/S, assessed incisional pain and providing guidance on normal vs prolonged recovery If pt had a laceration, perineal healing and pain reviewed.  If urinary or fecal incontinence, discussed management and referred to PT or uro/gyn if indicated  Patient is safe to resume physical activity. Discussed attainment of healthy weight.  6.  Chronic disease management Discussed  pregnancy complications if any, and their implications for future childbearing and long-term maternal health. Review recommendations for prevention of recurrent pregnancy complications, such as 17 hydroxyprogesterone caproate to reduce risk for recurrent PTB not applicable, or aspirin to reduce risk of preeclampsia not applicable. Pt had GDM: no. If yes, 2hr GTT scheduled: not applicable. Reviewed medications and non-pregnant dosing including consideration of whether pt is breastfeeding using a reliable resource such as LactMed: not applicable Referred for f/u w/ PCP or subspecialist providers as indicated: wants to find PCP on her own  7. Health maintenance Mammogram at 24yo or earlier if indicated Pap smears as indicated  Meds:  Meds ordered this encounter  Medications   Lactic Ac-Citric Ac-Pot Bitart (PHEXXI) 1.8-1-0.4 % GEL    Sig: Place 1 applicator vaginally daily. Within an hour of sex    Dispense:  120 g    Refill:  4    Order Specific Question:   Supervising Provider    Answer:   Janyth Pupa [[id number]]    Follow-up: Return in about 1 year (around 10/07/2023) for Physical.   No orders of the defined types were placed in this encounter.   Myrtis Ser CNM 10/06/2022 9:40 AM

## 2022-10-24 ENCOUNTER — Encounter: Payer: Self-pay | Admitting: Women's Health

## 2022-10-25 ENCOUNTER — Encounter: Payer: Self-pay | Admitting: *Deleted
